# Patient Record
Sex: Female | Born: 1985 | Race: White | Hispanic: No | Marital: Married | Smoking: Never smoker
Health system: Southern US, Community
[De-identification: ages and names within clinical notes are randomized; demographics above are authoritative.]

## PROBLEM LIST (undated history)

## (undated) DIAGNOSIS — N200 Calculus of kidney: Secondary | ICD-10-CM

## (undated) DIAGNOSIS — K449 Diaphragmatic hernia without obstruction or gangrene: Secondary | ICD-10-CM

## (undated) DIAGNOSIS — F329 Major depressive disorder, single episode, unspecified: Secondary | ICD-10-CM

## (undated) DIAGNOSIS — B009 Herpesviral infection, unspecified: Secondary | ICD-10-CM

## (undated) DIAGNOSIS — R17 Unspecified jaundice: Secondary | ICD-10-CM

## (undated) DIAGNOSIS — Z800 Family history of malignant neoplasm of digestive organs: Secondary | ICD-10-CM

## (undated) DIAGNOSIS — F419 Anxiety disorder, unspecified: Secondary | ICD-10-CM

## (undated) DIAGNOSIS — IMO0002 Reserved for concepts with insufficient information to code with codable children: Secondary | ICD-10-CM

## (undated) DIAGNOSIS — K599 Functional intestinal disorder, unspecified: Secondary | ICD-10-CM

## (undated) DIAGNOSIS — Z8041 Family history of malignant neoplasm of ovary: Secondary | ICD-10-CM

## (undated) DIAGNOSIS — Z87442 Personal history of urinary calculi: Secondary | ICD-10-CM

## (undated) DIAGNOSIS — Z1379 Encounter for other screening for genetic and chromosomal anomalies: Principal | ICD-10-CM

## (undated) HISTORY — PX: TONSILLECTOMY: SUR1361

## (undated) HISTORY — DX: Herpesviral infection, unspecified: B00.9

## (undated) HISTORY — DX: Unspecified jaundice: R17

## (undated) HISTORY — DX: Gilbert syndrome: E80.4

## (undated) HISTORY — PX: COLPOSCOPY: SHX161

## (undated) HISTORY — DX: Functional intestinal disorder, unspecified: K59.9

## (undated) HISTORY — DX: Encounter for other screening for genetic and chromosomal anomalies: Z13.79

## (undated) HISTORY — DX: Anxiety disorder, unspecified: F41.9

## (undated) HISTORY — PX: SHOULDER ARTHROSCOPY WITH ROTATOR CUFF REPAIR AND OPEN BICEPS TENODESIS: SHX6677

## (undated) HISTORY — DX: Major depressive disorder, single episode, unspecified: F32.9

## (undated) HISTORY — DX: Diaphragmatic hernia without obstruction or gangrene: K44.9

## (undated) HISTORY — DX: Family history of malignant neoplasm of ovary: Z80.41

## (undated) HISTORY — DX: Reserved for concepts with insufficient information to code with codable children: IMO0002

## (undated) HISTORY — DX: Family history of malignant neoplasm of digestive organs: Z80.0

---

## 2007-11-16 ENCOUNTER — Encounter: Admission: RE | Admit: 2007-11-16 | Discharge: 2007-11-16 | Payer: Self-pay | Admitting: Family Medicine

## 2007-12-06 ENCOUNTER — Emergency Department (HOSPITAL_COMMUNITY): Admission: EM | Admit: 2007-12-06 | Discharge: 2007-12-06 | Payer: Self-pay | Admitting: Emergency Medicine

## 2008-04-27 ENCOUNTER — Emergency Department (HOSPITAL_COMMUNITY): Admission: EM | Admit: 2008-04-27 | Discharge: 2008-04-27 | Payer: Self-pay | Admitting: Family Medicine

## 2008-10-17 ENCOUNTER — Encounter: Admission: RE | Admit: 2008-10-17 | Discharge: 2008-10-17 | Payer: Self-pay

## 2010-10-31 DIAGNOSIS — R87619 Unspecified abnormal cytological findings in specimens from cervix uteri: Secondary | ICD-10-CM

## 2010-10-31 DIAGNOSIS — IMO0002 Reserved for concepts with insufficient information to code with codable children: Secondary | ICD-10-CM

## 2010-10-31 HISTORY — DX: Unspecified abnormal cytological findings in specimens from cervix uteri: R87.619

## 2010-10-31 HISTORY — DX: Reserved for concepts with insufficient information to code with codable children: IMO0002

## 2011-08-21 LAB — URINALYSIS, ROUTINE W REFLEX MICROSCOPIC
Bilirubin Urine: NEGATIVE
Glucose, UA: NEGATIVE
Nitrite: NEGATIVE
Urobilinogen, UA: 0.2

## 2011-08-21 LAB — URINE MICROSCOPIC-ADD ON

## 2011-08-21 LAB — PREGNANCY, URINE: Preg Test, Ur: NEGATIVE

## 2011-11-06 ENCOUNTER — Encounter: Payer: Self-pay | Admitting: Emergency Medicine

## 2011-11-06 ENCOUNTER — Emergency Department (HOSPITAL_COMMUNITY)
Admission: EM | Admit: 2011-11-06 | Discharge: 2011-11-06 | Disposition: A | Discharge status: Home / Self Care | Payer: BC Managed Care – PPO | Attending: Emergency Medicine | Admitting: Emergency Medicine

## 2011-11-06 ENCOUNTER — Emergency Department (HOSPITAL_COMMUNITY): Payer: BC Managed Care – PPO

## 2011-11-06 DIAGNOSIS — R109 Unspecified abdominal pain: Secondary | ICD-10-CM | POA: Insufficient documentation

## 2011-11-06 DIAGNOSIS — N201 Calculus of ureter: Secondary | ICD-10-CM

## 2011-11-06 HISTORY — DX: Calculus of kidney: N20.0

## 2011-11-06 LAB — URINALYSIS, DIPSTICK ONLY
Glucose, UA: NEGATIVE mg/dL
Protein, ur: NEGATIVE mg/dL
Specific Gravity, Urine: 1.024 (ref 1.005–1.030)
Urobilinogen, UA: 0.2 mg/dL (ref 0.0–1.0)

## 2011-11-06 LAB — POCT I-STAT, CHEM 8
Calcium, Ion: 1.14 mmol/L (ref 1.12–1.32)
HCT: 39 % (ref 36.0–46.0)
Sodium: 142 mEq/L (ref 135–145)

## 2011-11-06 MED ORDER — ONDANSETRON HCL 4 MG/2ML IJ SOLN
4.0000 mg | Freq: Once | INTRAMUSCULAR | Status: AC
  Administered 2011-11-06: 4 mg via INTRAVENOUS
  Filled 2011-11-06: qty 2

## 2011-11-06 MED ORDER — KETOROLAC TROMETHAMINE 30 MG/ML IJ SOLN
30.0000 mg | Freq: Once | INTRAMUSCULAR | Status: AC
  Administered 2011-11-06: 30 mg via INTRAVENOUS
  Filled 2011-11-06: qty 1

## 2011-11-06 MED ORDER — HYDROMORPHONE HCL PF 1 MG/ML IJ SOLN
1.0000 mg | Freq: Once | INTRAMUSCULAR | Status: AC
  Administered 2011-11-06: 1 mg via INTRAVENOUS

## 2011-11-06 MED ORDER — SODIUM CHLORIDE 0.9 % IV SOLN
Freq: Once | INTRAVENOUS | Status: AC
  Administered 2011-11-06: 05:00:00 via INTRAVENOUS

## 2011-11-06 MED ORDER — ONDANSETRON HCL 4 MG PO TABS: 4.0000 mg | ORAL_TABLET | Freq: Four times a day (QID) | ORAL | Status: AC | PRN

## 2011-11-06 MED ORDER — IBUPROFEN 800 MG PO TABS: 800.0000 mg | ORAL_TABLET | Freq: Three times a day (TID) | ORAL | Status: AC

## 2011-11-06 MED ORDER — OXYCODONE-ACETAMINOPHEN 5-325 MG PO TABS: 1.0000 | ORAL_TABLET | Freq: Four times a day (QID) | ORAL | Status: AC | PRN

## 2011-11-06 MED ORDER — TAMSULOSIN HCL 0.4 MG PO CAPS: 0.4000 mg | ORAL_CAPSULE | Freq: Every day | ORAL | Status: DC

## 2011-11-06 MED ORDER — HYDROMORPHONE HCL PF 2 MG/ML IJ SOLN
INTRAMUSCULAR | Status: AC
  Filled 2011-11-06: qty 1

## 2011-11-06 NOTE — ED Notes (Signed)
Pt alert, nad, c/o right flank pain, onset sudden, sharp, non radiating, skin pwd, resp even unlabored, denies changes in bowel or bladder, c/o mild nausea

## 2011-11-06 NOTE — ED Provider Notes (Signed)
Medical screening examination/treatment/procedure(s) were performed by non-physician practitioner and as supervising physician I was immediately available for consultation/collaboration.  Olivia Mackie, MD 11/06/11 [phone]

## 2011-11-06 NOTE — ED Provider Notes (Signed)
Ct Abdomen Pelvis Wo Contrast  11/06/2011  *RADIOLOGY REPORT*  Clinical Data: Right flank pain and right lower quadrant abdominal pain.  Nausea and hematuria.  CT ABDOMEN AND PELVIS WITHOUT CONTRAST  Technique:  Multidetector CT imaging of the abdomen and pelvis was performed following the standard protocol without intravenous contrast.  Comparison: CT of the abdomen and pelvis performed 12/06/2007  Findings: The visualized lung bases are clear.  The liver and spleen are unremarkable in appearance.  The gallbladder is within normal limits.  The pancreas and adrenal glands are unremarkable.  There is no evidence of hydronephrosis.  However, there is slightly asymmetric prominence of the right ureter, at least proximally, with suspicion of a small 3 mm stone at the distal right ureter, just above the vesicoureteral junction.  This likely reflects an intermittently obstructing stone, causing the patient's symptoms.  Scattered nonobstructing bilateral renal stones are seen, measuring up to 3 mm in size.  The kidneys are otherwise unremarkable in appearance.  There is no evidence of perinephric stranding.  No free fluid is identified.  There is mild fecalization of the distal ileum; this suggests some degree of small bowel dysmotility. The remainder of the small bowel is decompressed and unremarkable in appearance.  The stomach is within normal limits.  No acute vascular abnormalities are seen.  The appendix is normal in caliber, without evidence for appendicitis.  The colon is largely filled with stool and is unremarkable in appearance.  The bladder is mildly distended and grossly unremarkable in appearance; a small urachal remnant is incidentally noted.  The uterus is grossly unremarkable; the ovaries are relatively symmetric.  No suspicious adnexal masses are seen.  A tampon is noted within the vagina.  No inguinal lymphadenopathy is seen.  No acute osseous abnormalities are identified.  A focal exostosis is noted  arising from the posteromedial left 12th rib, with associated atelectasis.  This impresses on the left diaphragm.  IMPRESSION:  1.  No evidence of hydronephrosis.  However, there is slightly asymmetric prominence of the right ureter, with a suspected 3 mm stone at the distal right ureter, just above the vesicoureteral junction.  This likely reflects an intermittently obstructing stone, causing the patient's symptoms. 2.  Scattered small nonobstructing bilateral renal stones seen, measuring up to 3 mm in size. 3.  Mild fecalization of the distal ileum suggests some degree of small bowel dysmotility; small bowel otherwise unremarkable in appearance. 4.  Focal benign exostosis incidentally noted arising from the posteromedial left 12th rib, with associated atelectasis.  This impresses on the left diaphragm, and appears stable from 2009.  Original Report Authenticated By: Tonia Ghent, M.D.    Patient's care was assumed by myself at the beginning of shift. I reviewed her labs and imaging study. She has evidence of a 3 mm stone in the distal right ureter as described as intermittently obstructing. As her blood work shows no indication of renal insufficiency and her urinalysis shows no evidence of infection, she is good candidate for outpatient management with a close urology followup. I will send her home with prescriptions for ibuprofen, Zofran, Percocet, and Flomax with instructions to call the urologist today.   [address] [place], [place] 11/06/11 [phone]

## 2011-11-06 NOTE — ED Provider Notes (Addendum)
History     CSN: [id number] Arrival date & time: 11/06/2011  3:19 AM   First MD Initiated Contact with Patient 11/06/11 0423      Chief Complaint  Patient presents with  . Flank Pain    Right    (Consider location/radiation/quality/duration/timing/severity/associated sxs/prior treatment) HPI Comments: Acute onset right flank pain were person from sleep, nausea, and vomiting as a result pain has a history of kidney stone 2-3 years ago on the right, which needed.  Lithotripsy, as she was unable to pass down this was in [facility], has no local urologist or PCP.  Has not taken any over-the-counter medicines for pain on nausea  Patient is a 25 y.o. female presenting with flank pain. The history is provided by the patient.  Flank Pain This is a new problem. The current episode started today. The problem occurs constantly. The problem has been rapidly worsening. Associated symptoms include nausea and vomiting. Pertinent negatives include no abdominal pain, chills or fever. The symptoms are aggravated by nothing. She has tried nothing for the symptoms. The treatment provided no relief.    Past Medical History  Diagnosis Date  . Kidney stones     rt with lithotripsy    History reviewed. No pertinent past surgical history.  No family history on file.  History  Substance Use Topics  . Smoking status: Not on file  . Smokeless tobacco: Not on file  . Alcohol Use: Not on file    OB History    Grav Para Term Preterm Abortions TAB SAB Ect Mult Living                  Review of Systems  Constitutional: Negative for fever and chills.  HENT: Negative.   Eyes: Negative.   Respiratory: Negative.   Cardiovascular: Negative.   Gastrointestinal: Positive for nausea and vomiting. Negative for abdominal pain.  Genitourinary: Positive for flank pain. Negative for dysuria and hematuria.  Musculoskeletal: Negative.   Neurological: Negative.   Hematological: Negative.     Psychiatric/Behavioral: Negative.     Allergies  Review of patient's allergies indicates no known allergies.  Home Medications   Current Outpatient Rx  Name Route Sig Dispense Refill  . CITALOPRAM HYDROBROMIDE 20 MG PO TABS Oral Take 10 mg by mouth daily. Patient is trying to reduce her intake so she tries to take it every other day     . TAMSULOSIN HCL 0.4 MG PO CAPS Oral Take 1 capsule (0.4 mg total) by mouth daily. 10 capsule 0    BP 104/47  Pulse 67  Temp(Src) 97.8 F (36.6 C) (Oral)  Resp 16  Wt 150 lb (68.04 kg)  SpO2 100%  LMP 11/06/2011  Physical Exam  ED Course  Procedures (including critical care time)  Labs Reviewed  URINALYSIS, DIPSTICK ONLY - Abnormal; Notable for the following:    Hgb urine dipstick LARGE (*)    Leukocytes, UA TRACE (*)    All other components within normal limits  POCT I-STAT, CHEM 8 - Abnormal; Notable for the following:    Potassium 3.4 (*)    Glucose, Bld 127 (*)    All other components within normal limits  POCT PREGNANCY, URINE  LAB REPORT - SCANNED   No results found.   1. Right ureteral stone       MDM  Will evaluate, urine, and kidney function provide IV hydration, pain control antiemetic.  Review CT scan        Arman Filter,  NP 11/06/11 0439  Arman Filter, NP 12/26/11 1939

## 2011-11-06 NOTE — ED Notes (Signed)
Patient transported to CT 

## 2011-11-06 NOTE — ED Provider Notes (Signed)
Medical screening examination/treatment/procedure(s) were performed by non-physician practitioner and as supervising physician I was immediately available for consultation/collaboration.  Olivia Mackie, MD 11/06/11 [phone]

## 2012-01-15 NOTE — ED Provider Notes (Signed)
Medical screening examination/treatment/procedure(s) were performed by non-physician practitioner and as supervising physician I was immediately available for consultation/collaboration.  Olivia Mackie, MD 01/15/12 [phone]

## 2012-07-14 ENCOUNTER — Other Ambulatory Visit: Payer: Self-pay | Admitting: Family Medicine

## 2012-07-14 DIAGNOSIS — R1011 Right upper quadrant pain: Secondary | ICD-10-CM

## 2012-07-16 ENCOUNTER — Other Ambulatory Visit (HOSPITAL_COMMUNITY): Payer: Self-pay | Admitting: Family Medicine

## 2012-07-16 ENCOUNTER — Ambulatory Visit
Admission: RE | Admit: 2012-07-16 | Discharge: 2012-07-16 | Disposition: A | Discharge status: Home / Self Care | Payer: BC Managed Care – PPO | Source: Ambulatory Visit | Attending: Family Medicine | Admitting: Family Medicine

## 2012-07-16 DIAGNOSIS — R1011 Right upper quadrant pain: Secondary | ICD-10-CM

## 2012-07-26 ENCOUNTER — Encounter (HOSPITAL_COMMUNITY)
Admission: RE | Admit: 2012-07-26 | Discharge: 2012-07-26 | Disposition: A | Discharge status: Home / Self Care | Payer: BC Managed Care – PPO | Source: Ambulatory Visit | Attending: Family Medicine | Admitting: Family Medicine

## 2012-07-26 DIAGNOSIS — R1011 Right upper quadrant pain: Secondary | ICD-10-CM

## 2012-07-26 MED ORDER — TECHNETIUM TC 99M MEBROFENIN IV KIT
5.0000 | PACK | Freq: Once | INTRAVENOUS | Status: AC | PRN
  Administered 2012-07-26: 5 via INTRAVENOUS

## 2012-07-26 MED ORDER — SINCALIDE 5 MCG IJ SOLR
0.0200 ug/kg | Freq: Once | INTRAMUSCULAR | Status: AC
  Administered 2012-07-26: 1.4 ug via INTRAVENOUS

## 2013-06-07 ENCOUNTER — Encounter: Payer: Self-pay | Admitting: Obstetrics and Gynecology

## 2013-06-07 ENCOUNTER — Ambulatory Visit (INDEPENDENT_AMBULATORY_CARE_PROVIDER_SITE_OTHER): Payer: BC Managed Care – PPO | Admitting: Obstetrics and Gynecology

## 2013-06-07 VITALS — BP 100/60 | Ht 64.75 in | Wt 149.0 lb

## 2013-06-07 DIAGNOSIS — N9489 Other specified conditions associated with female genital organs and menstrual cycle: Secondary | ICD-10-CM

## 2013-06-07 MED ORDER — ETONOGESTREL-ETHINYL ESTRADIOL 0.12-0.015 MG/24HR VA RING: VAGINAL_RING | VAGINAL | Status: DC

## 2013-06-07 NOTE — Patient Instructions (Signed)
Start nuva ring.  If pain persists after 3 months, return and see Dr Edward Jolly and consider laparoscopy.

## 2013-06-07 NOTE — Progress Notes (Signed)
27 yo MWF G0P0 with recurrent episodes of abdominal pain.  Had eval of GB and was told it was nl.  Using condoms for bc.  Quit nuva ring last summer and periods were nl for awhile, but for the last 2 months menses have been so light that she could have used a single tampon for all 5 days of bleeding.  Pt saw a doctor in [place] a few months ago and she did an USG which showed cysts on her ovaries which were small and was asked to have a repeat PUS 6-8 weeks later and to continue her nuva ring.  Pt has rather severe dysmenorrhea despite almost no bleeding.  Also has dyspareunia and post coital bleeding.  USG did not include a SHSG.  Pt was having dysmenorrhea even when on nuva ring.  Pt also having constipation, with BM's difficult to evacuate, and occuring q2days.  Pap smear about 2 months ago nl.  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  Pain during her period is incapacitating and alters her usual activities.  Records reviewed and show a 3 cm poss endometrioma on left.  Exam:  Abd soft and flat.  Nl BS.     Ext gen nl.     Vag and cx nl.      BM: uterus tender to motion but is mobile and is nl size. Adnexa not tender, no masses.  A:  Recurrent pelvic pain, prob endometriosis  P:  Long discussion with patient regarding options for management including laparoscopy, coc's or lupron.  Risks and benefits of each discussed and questions answered.  Pt states she wants to try the nuva ring first, and will consider laparoscopy if it doesn't help.  She and her husband do not desire children, so infertility is not really a concern for her.  She states she feels better now that she understands why her other doctor rec: nuva and is comfortable starting it.  Rx for 1 year.  Pt will return in 3 months if her pain is not controlled with nuva.

## 2013-07-13 ENCOUNTER — Telehealth: Payer: Self-pay | Admitting: Obstetrics and Gynecology

## 2013-07-13 NOTE — Telephone Encounter (Signed)
Left message on call back # to return call for medication.

## 2013-07-13 NOTE — Telephone Encounter (Signed)
Patient last OV 06/07/2013 EPIC States she previously was on Lexapro for couple years but stopped and has done ok with out till now and is requesting if you can call in for her due to her feelings now of week before cycle having mood swings , feels borderline neurotic. States she is doing OK with the Nuvaring. She did get mouth sores 2 days after starting this lasted 2 weeks and was aware this was a side effect to the nuvaring. Please advise if Lexapro may be sent to her  Pharmacy [place] , [place].

## 2013-07-13 NOTE — Telephone Encounter (Signed)
Pull her paper chart please

## 2013-07-13 NOTE — Telephone Encounter (Signed)
Patient wants discuss adding lexapro her meds. Due to her having endometriosis.

## 2013-07-15 NOTE — Telephone Encounter (Signed)
I have never evaluated or treated pt for this problem.  I will need to see her in the office, and then if appropriate, I will rx the medicine.

## 2013-07-15 NOTE — Telephone Encounter (Signed)
Left message on CB#VM of need to return call concerning Rx request for Lexapro.

## 2013-07-18 NOTE — Telephone Encounter (Signed)
Calling patient concerning Rx request for Lexapro to inform of need for appointment with Dr. Tresa Res. Patient stated was going to call our office today to get appointment . At her last visit with Dr. Tresa Res she had discussed  Her options concerning endometriosis. Patient last OV on 06/07/2013. Patient states she has had a very discomforting week end due to heavy bleeding and nausea symptoms .,. 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 Edward Jolly who Dr. Tresa Res had suggested she follow through with if problems continue.  Patient scheduled appointment with Dr. Edward Jolly for Thursday at 7:45am.

## 2013-07-21 ENCOUNTER — Ambulatory Visit (INDEPENDENT_AMBULATORY_CARE_PROVIDER_SITE_OTHER): Payer: BC Managed Care – PPO | Admitting: Obstetrics and Gynecology

## 2013-07-21 ENCOUNTER — Encounter: Payer: Self-pay | Admitting: Obstetrics and Gynecology

## 2013-07-21 VITALS — BP 100/58 | HR 70 | Ht 64.75 in | Wt 148.5 lb

## 2013-07-21 DIAGNOSIS — N943 Premenstrual tension syndrome: Secondary | ICD-10-CM

## 2013-07-21 DIAGNOSIS — N83209 Unspecified ovarian cyst, unspecified side: Secondary | ICD-10-CM

## 2013-07-21 DIAGNOSIS — K5900 Constipation, unspecified: Secondary | ICD-10-CM

## 2013-07-21 DIAGNOSIS — F3281 Premenstrual dysphoric disorder: Secondary | ICD-10-CM

## 2013-07-21 LAB — TSH: TSH: 1.566 u[IU]/mL (ref 0.350–4.500)

## 2013-07-21 MED ORDER — ESCITALOPRAM OXALATE 10 MG PO TABS: 10.0000 mg | ORAL_TABLET | Freq: Every day | ORAL | Status: DC

## 2013-07-21 NOTE — Progress Notes (Signed)
Patient ID: Beth Huerta, female   DOB: 07-20-1986, 27 y.o.   MRN: [id number]  27 y.o.   Married    Caucasian   female   G0P0000   here for recheck. Biggest concern is severe constipation during her cycle.   Started one year ago.   Took Percocet this summer when she was off the NuvaRing.  Since restarting the NuvaRing, constipation is still not much better, but the cramping is better.   Difficulty walking.  Spending hours in the bathroom due to difficulty to have BMs. Misses activities due to pain.   Bleeding is very light and old blood mixed with mucous.    Stopped Lexapro and notes increased mood swings and patient is worried about this.  Was last on it two years ago to treat anxiety. Notes increase in anxiety for the last 6 - 7 months.  Symptoms are mainly right before menses. No suicidal or homicidal ideation.  Lashing out.   Seen a gastroenterologist and had a normal gall bladder evaluation including ultrasound. No colonoscopy to date.    Last ultrasound of pelvis was 6 months ago in [place] and cysts were noted.    Notes some mouth sores since back on NuvaRing.  Used in the past without the problems.  Does not remember pills well.  Does not want to switch.     Considering future childbearing but not at this time.   Maternal grandmother and maternal aunt diagnosed with ovarian cancer. Patient mother tested negative for BRCA testing.   Patient concerned about this.  Insurance would not pay for it within the last year.    Patient's last menstrual period was 07/14/2013.          Sexually active: yes     Family History  Problem Relation Age of Onset  . Ovarian cancer Maternal Grandmother   . Heart disease Paternal Grandfather   . Cancer Father     lung cancer    There are no active problems to display for this patient.   Past Medical History  Diagnosis Date  . Kidney stones     rt with lithotripsy  . Depression     and anxiety  . Abnormal Pap smear  10/2010    colpo/CIN II    Past Surgical History  Procedure Laterality Date  . Tonsillectomy    . Colposcopy      CIN II    Allergies: Review of patient's allergies indicates no known allergies.  Current Outpatient Prescriptions  Medication Sig Dispense Refill  . Acetaminophen (TYLENOL PO) Take by mouth as needed.      . etonogestrel-ethinyl estradiol (NUVARING) 0.12-0.015 MG/24HR vaginal ring Insert vaginally and leave in place for 3 consecutive weeks, then remove for 1 week.  3 each  3  . IBUPROFEN PO Take by mouth as needed.       No current facility-administered medications for this visit.    ROS: Pertinent items are noted in HPI.  Social Hx:    Exam:    BP 100/58  Pulse 70  Ht 5' 4.75" (1.645 m)  Wt 148 lb 8 oz (67.359 kg)  BMI 24.89 kg/m2  LMP 07/14/2013   Wt Readings from Last 3 Encounters:  07/21/13 148 lb 8 oz (67.359 kg)  06/07/13 149 lb (67.586 kg)  11/06/11 150 lb (68.04 kg)     Ht Readings from Last 3 Encounters:  07/21/13 5' 4.75" (1.645 m)  06/07/13 5' 4.75" (1.645 m)    General appearance:  alert, cooperative and appears stated age  Abdomen: soft, non-tender; bowel sounds normal; no masses,  no organomegaly Neurologic: Grossly normal   Pelvic: External genitalia:  no lesions              Urethra:  normal appearing urethra with no masses, tenderness or lesions              Bartholins and Skenes: normal                 Vagina: normal appearing vagina with normal color and discharge, no lesions              Cervix: normal appearance                     Bimanual Exam:  Uterus:  uterus is normal size, shape, consistency and nontender.  NuvaRing in place.                                      Adnexa: normal adnexa in size, nontender and no masses                                      Rectovaginal: Confirms                                      Anus:  normal sphincter tone, no lesions  Assessment  Severe constipation during  menses. Dysmenorrhea. Ovarian cyst Mood swings. Premenstrual dysphoric disorder.  Plan  Check TSH. Continue NuvaRing Referral to Gi for colonoscopy Return for pelvic ultrasound Restart Lexapro.  An After Visit Summary was printed and given to the patient.

## 2013-07-21 NOTE — Patient Instructions (Signed)
Constipation, Adult Constipation is when a person has fewer than 3 bowel movements a week; has difficulty having a bowel movement; or has stools that are dry, hard, or larger than normal. As people grow older, constipation is more common. If you try to fix constipation with medicines that make you have a bowel movement (laxatives), the problem may get worse. Long-term laxative use may cause the muscles of the colon to become weak. A low-fiber diet, not taking in enough fluids, and taking certain medicines may make constipation worse. CAUSES   Certain medicines, such as antidepressants, pain medicine, iron supplements, antacids, and water pills.   Certain diseases, such as diabetes, irritable bowel syndrome (IBS), thyroid disease, or depression.   Not drinking enough water.   Not eating enough fiber-rich foods.   Stress or travel.  Lack of physical activity or exercise.  Not going to the restroom when there is the urge to have a bowel movement.  Ignoring the urge to have a bowel movement.  Using laxatives too much. SYMPTOMS   Having fewer than 3 bowel movements a week.   Straining to have a bowel movement.   Having hard, dry, or larger than normal stools.   Feeling full or bloated.   Pain in the lower abdomen.  Not feeling relief after having a bowel movement. DIAGNOSIS  Your caregiver will take a medical history and perform a physical exam. Further testing may be done for severe constipation. Some tests may include:   A barium enema X-ray to examine your rectum, colon, and sometimes, your small intestine.  A sigmoidoscopy to examine your lower colon.  A colonoscopy to examine your entire colon. TREATMENT  Treatment will depend on the severity of your constipation and what is causing it. Some dietary treatments include drinking more fluids and eating more fiber-rich foods. Lifestyle treatments may include regular exercise. If these diet and lifestyle recommendations  do not help, your caregiver may recommend taking over-the-counter laxative medicines to help you have bowel movements. Prescription medicines may be prescribed if over-the-counter medicines do not work.  HOME CARE INSTRUCTIONS   Increase dietary fiber in your diet, such as fruits, vegetables, whole grains, and beans. Limit high-fat and processed sugars in your diet, such as [place] fries, hamburgers, cookies, candies, and soda.   A fiber supplement may be added to your diet if you cannot get enough fiber from foods.   Drink enough fluids to keep your urine clear or pale yellow.   Exercise regularly or as directed by your caregiver.   Go to the restroom when you have the urge to go. Do not hold it.  Only take medicines as directed by your caregiver. Do not take other medicines for constipation without talking to your caregiver first. SEEK IMMEDIATE MEDICAL CARE IF:   You have bright red blood in your stool.   Your constipation lasts for more than 4 days or gets worse.   You have abdominal or rectal pain.   You have thin, pencil-like stools.  You have unexplained weight loss. MAKE SURE YOU:   Understand these instructions.  Will watch your condition.  Will get help right away if you are not doing well or get worse. Document Released: 08/15/2004 Document Revised: 02/09/2012 Document Reviewed: 10/21/2011 [facility] Patient Information 2014 [place], [place].  Diagnostic Laparoscopy Laparoscopy is a surgical procedure. It is used to diagnose and treat diseases inside the belly(abdomen). It is usually a brief, common, and relatively simple procedure. The laparoscopeis a thin, lighted, pencil-sized instrument.  It is like a telescope. It is inserted into your abdomen through a small cut (incision). Your caregiver can look at the organs inside your body through this instrument. He or she can see if there is anything abnormal. Laparoscopy can be done either in a hospital or outpatient  clinic. You may be given a mild sedative to help you relax before the procedure. Once in the operating room, you will be given a drug to make you sleep (general anesthesia). Laparoscopy usually lasts less than 1 hour. After the procedure, you will be monitored in a recovery area until you are stable and doing well. Once you are home, it will take 2 to 3 days to fully recover. RISKS AND COMPLICATIONS  Laparoscopy has relatively few risks. Your caregiver will discuss the risks with you before the procedure. Some problems that can occur include:  Infection.  Bleeding.  Damage to other organs.  Anesthetic side effects. PROCEDURE Once you receive anesthesia, your surgeon inflates the abdomen with a harmless gas (carbon dioxide). This makes the organs easier to see. The laparoscope is inserted into the abdomen through a small incision. This allows your surgeon to see into the abdomen. Other small instruments are also inserted into the abdomen through other small openings. Many surgeons attach a video camera to the laparoscope to enlarge the view. During a diagnostic laparoscopy, the surgeon may be looking for inflammation, infection, or cancer. Your surgeon may take tissue samples(biopsies). The samples are sent to a specialist in looking at cells and tissue samples (pathologist). The pathologist examines them under a microscope. Biopsies can help to diagnose or confirm a disease. AFTER THE PROCEDURE   The gas is released from inside the abdomen.  The incisions are closed with stitches (sutures). Because these incisions are small (usually less than 1/2 inch), there is usually minimal discomfort after the procedure. There may be some mild discomfort in the throat. This is from the tube placed in the throat while you were sleeping. You may have some mild abdominal discomfort. There may also be discomfort from the instrument placement incisions in the abdomen.  The recovery time is shortened as long as  there are no complications.  You will rest in a recovery room until stable and doing well. As long as there are no complications, you may be allowed to go home. FINDING OUT THE RESULTS OF YOUR TEST Not all test results are available during your visit. If your test results are not back during the visit, make an appointment with your caregiver to find out the results. Do not assume everything is normal if you have not heard from your caregiver or the medical facility. It is important for you to follow up on all of your test results. HOME CARE INSTRUCTIONS   Take all medicines as directed.  Only take over-the-counter or prescription medicines for pain, discomfort, or fever as directed by your caregiver.  Resume daily activities as directed.  Showers are preferred over baths.  You may resume sexual activities in 1 week or as directed.  Do not drive while taking narcotics. SEEK MEDICAL CARE IF:   There is increasing abdominal pain.  There is new pain in the shoulders (shoulder strap areas).  You feel lightheaded or faint.  You have the chills.  You or your child has an oral temperature above 102 F (38.9 C).  There is pus-like (purulent) drainage from any of the wounds.  You are unable to pass gas or have a bowel movement.  You feel sick to your stomach (nauseous) or throw up (vomit). MAKE SURE YOU:   Understand these instructions.  Will watch your condition.  Will get help right away if you are not doing well or get worse. Document Released: 02/23/2001 Document Revised: 02/09/2012 Document Reviewed: 11/17/2007 [facility] Patient Information 2014 [place], [place].

## 2013-07-27 ENCOUNTER — Encounter: Payer: Self-pay | Admitting: Internal Medicine

## 2013-07-27 ENCOUNTER — Telehealth: Payer: Self-pay | Admitting: Orthopedic Surgery

## 2013-07-27 NOTE — Telephone Encounter (Signed)
LVM re: appt with Dr. [name] 08-30-13 at 2:45, arriving at 2:30. Paperwork to be mailed to pt to fill out and bring, along with insurance card. Phone and address given.

## 2013-07-28 ENCOUNTER — Encounter: Payer: Self-pay | Admitting: *Deleted

## 2013-08-04 ENCOUNTER — Encounter: Payer: Self-pay | Admitting: Obstetrics and Gynecology

## 2013-08-04 ENCOUNTER — Other Ambulatory Visit: Payer: BC Managed Care – PPO

## 2013-08-04 ENCOUNTER — Ambulatory Visit (INDEPENDENT_AMBULATORY_CARE_PROVIDER_SITE_OTHER): Payer: BC Managed Care – PPO | Admitting: Obstetrics and Gynecology

## 2013-08-04 ENCOUNTER — Ambulatory Visit (INDEPENDENT_AMBULATORY_CARE_PROVIDER_SITE_OTHER): Payer: BC Managed Care – PPO

## 2013-08-04 VITALS — BP 110/66 | HR 64 | Ht 64.75 in | Wt 150.0 lb

## 2013-08-04 DIAGNOSIS — N83209 Unspecified ovarian cyst, unspecified side: Secondary | ICD-10-CM

## 2013-08-04 DIAGNOSIS — N83202 Unspecified ovarian cyst, left side: Secondary | ICD-10-CM

## 2013-08-04 NOTE — Patient Instructions (Signed)
Ovarian Cyst The ovaries are small organs that are on each side of the uterus. The ovaries are the organs that produce the female hormones, estrogen and progesterone. An ovarian cyst is a sac filled with fluid that can vary in its size. It is normal for a small cyst to form in women who are in the childbearing age and who have menstrual periods. This type of cyst is called a follicle cyst that becomes an ovulation cyst (corpus luteum cyst) after it produces the women's egg. It later goes away on its own if the woman does not become pregnant. There are other kinds of ovarian cysts that may cause problems and may need to be treated. The most serious problem is a cyst with cancer. It should be noted that menopausal women who have an ovarian cyst are at a higher risk of it being a cancer cyst. They should be evaluated very quickly, thoroughly and followed closely. This is especially true in menopausal women because of the high rate of ovarian cancer in women in menopause. CAUSES AND TYPES OF OVARIAN CYSTS:  FUNCTIONAL CYST: The follicle/corpus luteum cyst is a functional cyst that occurs every month during ovulation with the menstrual cycle. They go away with the next menstrual cycle if the woman does not get pregnant. Usually, there are no symptoms with a functional cyst.  ENDOMETRIOMA CYST: This cyst develops from the lining of the uterus tissue. This cyst gets in or on the ovary. It grows every month from the bleeding during the menstrual period. It is also called a "chocolate cyst" because it becomes filled with blood that turns brown. This cyst can cause pain in the lower abdomen during intercourse and with your menstrual period.  CYSTADENOMA CYST: This cyst develops from the cells on the outside of the ovary. They usually are not cancerous. They can get very big and cause lower abdomen pain and pain with intercourse. This type of cyst can twist on itself, cut off its blood supply and cause severe pain. It  also can easily rupture and cause a lot of pain.  DERMOID CYST: This type of cyst is sometimes found in both ovaries. They are found to have different kinds of body tissue in the cyst. The tissue includes skin, teeth, hair, and/or cartilage. They usually do not have symptoms unless they get very big. Dermoid cysts are rarely cancerous.  POLYCYSTIC OVARY: This is a rare condition with hormone problems that produces many small cysts on both ovaries. The cysts are follicle-like cysts that never produce an egg and become a corpus luteum. It can cause an increase in body weight, infertility, acne, increase in body and facial hair and lack of menstrual periods or rare menstrual periods. Many women with this problem develop type 2 diabetes. The exact cause of this problem is unknown. A polycystic ovary is rarely cancerous.  THECA LUTEIN CYST: Occurs when too much hormone (human chorionic gonadotropin) is produced and over-stimulates the ovaries to produce an egg. They are frequently seen when doctors stimulate the ovaries for invitro-fertilization (test tube babies).  LUTEOMA CYST: This cyst is seen during pregnancy. Rarely it can cause an obstruction to the birth canal during labor and delivery. They usually go away after delivery. SYMPTOMS   Pelvic pain or pressure.  Pain during sexual intercourse.  Increasing girth (swelling) of the abdomen.  Abnormal menstrual periods.  Increasing pain with menstrual periods.  You stop having menstrual periods and you are not pregnant. DIAGNOSIS  The diagnosis can   be made during:  Routine or annual pelvic examination (common).  Ultrasound.  X-ray of the pelvis.  CT Scan.  MRI.  Blood tests. TREATMENT   Treatment may only be to follow the cyst monthly for 2 to 3 months with your caregiver. Many go away on their own, especially functional cysts.  May be aspirated (drained) with a long needle with ultrasound, or by laparoscopy (inserting a tube into  the pelvis through a small incision).  The whole cyst can be removed by laparoscopy.  Sometimes the cyst may need to be removed through an incision in the lower abdomen.  Hormone treatment is sometimes used to help dissolve certain cysts.  Birth control pills are sometimes used to help dissolve certain cysts. HOME CARE INSTRUCTIONS  Follow your caregiver's advice regarding:  Medicine.  Follow up visits to evaluate and treat the cyst.  You may need to come back or make an appointment with another caregiver, to find the exact cause of your cyst, if your caregiver is not a gynecologist.  Get your yearly and recommended pelvic examinations and Pap tests.  Let your caregiver know if you have had an ovarian cyst in the past. SEEK MEDICAL CARE IF:   Your periods are late, irregular, they stop, or are painful.  Your stomach (abdomen) or pelvic pain does not go away.  Your stomach becomes larger or swollen.  You have pressure on your bladder or trouble emptying your bladder completely.  You have painful sexual intercourse.  You have feelings of fullness, pressure, or discomfort in your stomach.  You lose weight for no apparent reason.  You feel generally ill.  You become constipated.  You lose your appetite.  You develop acne.  You have an increase in body and facial hair.  You are gaining weight, without changing your exercise and eating habits.  You think you are pregnant. SEEK IMMEDIATE MEDICAL CARE IF:   You have increasing abdominal pain.  You feel sick to your stomach (nausea) and/or vomit.  You develop a fever that comes on suddenly.  You develop abdominal pain during a bowel movement.  Your menstrual periods become heavier than usual. Document Released: 11/17/2005 Document Revised: 02/09/2012 Document Reviewed: 09/20/2009 ExitCare Patient Information 2014 [employer]. Diagnostic Laparoscopy Laparoscopy is a surgical procedure. It is used to diagnose  and treat diseases inside the belly(abdomen). It is usually a brief, common, and relatively simple procedure. The laparoscopeis a thin, lighted, pencil-sized instrument. It is like a telescope. It is inserted into your abdomen through a small cut (incision). Your caregiver can look at the organs inside your body through this instrument. He or she can see if there is anything abnormal. Laparoscopy can be done either in a hospital or outpatient clinic. You may be given a mild sedative to help you relax before the procedure. Once in the operating room, you will be given a drug to make you sleep (general anesthesia). Laparoscopy usually lasts less than 1 hour. After the procedure, you will be monitored in a recovery area until you are stable and doing well. Once you are home, it will take 2 to 3 days to fully recover. RISKS AND COMPLICATIONS  Laparoscopy has relatively few risks. Your caregiver will discuss the risks with you before the procedure. Some problems that can occur include:  Infection.  Bleeding.  Damage to other organs.  Anesthetic side effects. PROCEDURE Once you receive anesthesia, your surgeon inflates the abdomen with a harmless gas (carbon dioxide). This makes the organs   easier to see. The laparoscope is inserted into the abdomen through a small incision. This allows your surgeon to see into the abdomen. Other small instruments are also inserted into the abdomen through other small openings. Many surgeons attach a video camera to the laparoscope to enlarge the view. During a diagnostic laparoscopy, the surgeon may be looking for inflammation, infection, or cancer. Your surgeon may take tissue samples(biopsies). The samples are sent to a specialist in looking at cells and tissue samples (pathologist). The pathologist examines them under a microscope. Biopsies can help to diagnose or confirm a disease. AFTER THE PROCEDURE   The gas is released from inside the abdomen.  The incisions  are closed with stitches (sutures). Because these incisions are small (usually less than 1/2 inch), there is usually minimal discomfort after the procedure. There may be some mild discomfort in the throat. This is from the tube placed in the throat while you were sleeping. You may have some mild abdominal discomfort. There may also be discomfort from the instrument placement incisions in the abdomen.  The recovery time is shortened as long as there are no complications.  You will rest in a recovery room until stable and doing well. As long as there are no complications, you may be allowed to go home. FINDING OUT THE RESULTS OF YOUR TEST Not all test results are available during your visit. If your test results are not back during the visit, make an appointment with your caregiver to find out the results. Do not assume everything is normal if you have not heard from your caregiver or the medical facility. It is important for you to follow up on all of your test results. HOME CARE INSTRUCTIONS   Take all medicines as directed.  Only take over-the-counter or prescription medicines for pain, discomfort, or fever as directed by your caregiver.  Resume daily activities as directed.  Showers are preferred over baths.  You may resume sexual activities in 1 week or as directed.  Do not drive while taking narcotics. SEEK MEDICAL CARE IF:   There is increasing abdominal pain.  There is new pain in the shoulders (shoulder strap areas).  You feel lightheaded or faint.  You have the chills.  You or your child has an oral temperature above 102 F (38.9 C).  There is pus-like (purulent) drainage from any of the wounds.  You are unable to pass gas or have a bowel movement.  You feel sick to your stomach (nauseous) or throw up (vomit). MAKE SURE YOU:   Understand these instructions.  Will watch your condition.  Will get help right away if you are not doing well or get worse. Document  Released: 02/23/2001 Document Revised: 02/09/2012 Document Reviewed: 11/17/2007 ExitCare Patient Information 2014 [employer].  

## 2013-08-04 NOTE — Progress Notes (Signed)
Subjective  Patient here for pelvic ultrasound. History of left ovarian cysts.  Has appointment 08/30/13 with Dr. Juanda Chance - GI - for constipation symptoms during menses.  Overall doing well with NuvaRing.  Still with post coital bleeding, pain with intercourse, and painful menses, that are worsening. Pain issues are bad enough that patient would like to consider surgery.   Wants to know if is OK to do a 1/2 marathon.  Objective  See ultrasound below.  Left ovarian cyst, 3.4 cm with internal echoes, consistent with possible endometrioma.  Normal left ovary and uterus.      Assessment  Constipation. Dysmenorrhea. Dyspareunia. Left ovarian cyst, possible endometrioma.  Plan  Keep appointment with Dr. Juanda Chance. Repeat ultrasound in 6 weeks. OK to continue with exercise regimen. Signs and symptoms of torsion reviewed.  I told the patient that this is not likely given the small size of the cyst.  Discussed with the patient the possibility of a laparoscopy.  Will wait to do final planning after has GI consult, colonoscopy, and repeat ultrasound.

## 2013-08-30 ENCOUNTER — Encounter: Payer: Self-pay | Admitting: Internal Medicine

## 2013-08-30 ENCOUNTER — Ambulatory Visit (INDEPENDENT_AMBULATORY_CARE_PROVIDER_SITE_OTHER): Payer: BC Managed Care – PPO | Admitting: Internal Medicine

## 2013-08-30 VITALS — BP 108/68 | HR 68 | Ht 64.5 in | Wt 149.4 lb

## 2013-08-30 DIAGNOSIS — R198 Other specified symptoms and signs involving the digestive system and abdomen: Secondary | ICD-10-CM

## 2013-08-30 DIAGNOSIS — R1031 Right lower quadrant pain: Secondary | ICD-10-CM

## 2013-08-30 MED ORDER — HYOSCYAMINE SULFATE 0.125 MG SL SUBL: 0.1250 mg | SUBLINGUAL_TABLET | SUBLINGUAL | Status: DC | PRN

## 2013-08-30 MED ORDER — MOVIPREP 100 G PO SOLR: 1.0000 | Freq: Once | ORAL | Status: DC

## 2013-08-30 NOTE — Patient Instructions (Addendum)
You have been scheduled for a colonoscopy with propofol. Please follow written instructions given to you at your visit today.  Please pick up your prep kit at the pharmacy within the next 1-3 days. If you use inhalers (even only as needed), please bring them with you on the day of your procedure. Your physician has requested that you go to www.startemmi.com and enter the access code given to you at your visit today. This web site gives a general overview about your procedure. However, you should still follow specific instructions given to you by our office regarding your preparation for the procedure.  We have sent the following medications to your pharmacy for you to pick up at your convenience: Levsin  Your physician has requested that you go to the basement for the following lab work before leaving today: CBC, Sed Rate  CC: Dr Edward Jolly

## 2013-08-30 NOTE — Progress Notes (Signed)
Beth Huerta February 20, 1986 MRN [id number]   History of Present Illness:  This is a 27 year old white female with episodic right middle lower and upper quadrant abdominal pain which started about a year ago in [place], [place]. She woke up with severe pain which lasted about 4-5 hours. Since then, she has had intermittent abdominal pain and severe constipation while on her period. In between her periods, she has somewhat loose stools about 3 times a day. She denies any rectal bleeding or weight loss. There is no family history of Crohn's disease or colitis. She was evaluated for lower abdominal pain by Dr.Silva and was diagnosed with possible endometriosis based on a pelvic ultrasound which showed a 4 cm left ovarian cyst suggestive of endometrioma. She also had a normal upper abdominal ultrasound and HIDA scan with ejection fraction of 65%. She has a positive family history of gallbladder disease in her father. She has a left ureteral stone and had colic in 2009 and in 2013. She, at that time, had a CT scan of the abdomen which did not show any evidence of terminal ileum Crohn's disease.    Past Medical History  Diagnosis Date  . Kidney stones     rt with lithotripsy  . Depression     and anxiety  . 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 Colonic dysmotility     small bowel  . Anxiety    Past Surgical History  Procedure Laterality Date  . Tonsillectomy    . Colposcopy      CIN II    reports that she has never smoked. She has never used smokeless tobacco. She reports that she drinks about 0.5 ounces of alcohol per week. She reports that she does not use illicit drugs. family history includes Colon cancer in her maternal grandmother; Heart disease in her paternal grandfather; Lung cancer in her father; Ovarian cancer in her maternal aunt and maternal grandmother. No Known Allergies      Review of Systems:Denies nausea vomiting between periods but having a lot of pain and severe  constipation while on her.  The remainder of the 10 point ROS is negative except as outlined in H&P   Physical Exam: General appearance  Well developed, in no distress.Healthy appearing  Eyes- non icteric. HEENT nontraumatic, normocephalic. Mouth no lesions, tongue papillated, no cheilosis. Neck supple without adenopathy, thyroid not enlarged, no carotid bruits, no JVD. Lungs Clear to auscultation bilaterally. Cor normal S1, normal S2, regular rhythm, no murmur,  quiet precordium. Abdomen: Soft flat relaxed abdomen with normal active bowel sounds. No tympany. No distention. Liver edge at costal margin. No scars. No tenderness in any quadrant. No CVA tenderness.  Rectal:Small amount of soft Hemoccult negative stool.  Extremities no pedal edema. Skin no lesions. Neurological alert and oriented x 3. Psychological normal mood and affect.  Assessment and Plan:  Problem #1 This is a 27 year old white female with intermittent severe constipation,  right lower and middle quadrant abdominal pain which needs to be further evaluated. Gallbladder disease has been ruled out. Another possibility is inflammatory bowel disease involving the right colon. We also need to consider endometriosis involving the surface of her bowel. Her symptoms are most prominent during her period which is suggestive of endometriosis .She will be scheduled for laparoscopic surgery by Dr.Siva to make a diagnosis. From my standpoint, we need to rule out inflammatory bowel disease, so we will schedule a colonoscopy and possible biopsies. Endometriosis can rarely cause a partial  obstruction of the colon if it is advanced and infiltrates colon wall.. It may also effect the motility of the colon. I will give her Levsin sublingually 0.125 mg to take on a when necessary basis. She may need a double prep for colonoscopy.  . I would like to do the colonoscopy while she is on  her period and when her symptoms of abdominal pain are most  pronounced. We have discussed the prep, the sedation and the procedure.    08/30/2013 [name]

## 2013-08-31 ENCOUNTER — Encounter: Payer: Self-pay | Admitting: Obstetrics and Gynecology

## 2013-08-31 ENCOUNTER — Encounter: Payer: Self-pay | Admitting: Internal Medicine

## 2013-08-31 ENCOUNTER — Telehealth: Payer: Self-pay | Admitting: Obstetrics and Gynecology

## 2013-08-31 ENCOUNTER — Telehealth: Payer: Self-pay | Admitting: Internal Medicine

## 2013-08-31 NOTE — Telephone Encounter (Signed)
Spoke with patient and she states she can do 09/07/13 at [facility] . Scheduled her at 3:30 PM.

## 2013-08-31 NOTE — Telephone Encounter (Signed)
Dr. Edward Jolly,  Patient calling with questions regarding future appointments.  Dr. Juanda Chance would like her to have colonoscopy done while she is on her period, patient states that insurance will not cover colonoscopy done at the hospital (as those are the dates that Dr. Juanda Chance has open while patient will be on her period) but will cover outpatient colonoscopy done at Dr. Regino Schultze office but she will not be on her period during the available dates that Dr. Regino Schultze office can do procedure.   Patient wants to know, should she cancel U/S that is scheduled here on 10/16? Can she wait to schedule anything until she has an appointment for outpatient endoscopy at the same time as her period?  She would like to know your opinion. States you can email her on mychart as well with your suggestions.

## 2013-08-31 NOTE — Telephone Encounter (Signed)
Spoke with patient and she has talked with her insurance company and her cost for the colonoscopy will be much higher at the hospital. She is asking if she can move the colonoscopy to Thursday 09/15/13 at [facility]. She states she thinks she will be on her period at that time also. Please, advise.

## 2013-08-31 NOTE — Telephone Encounter (Signed)
Yes, let's do it in [facility]/

## 2013-08-31 NOTE — Telephone Encounter (Signed)
I responded through My Chart.  I recommended keeping her ultrasound appointment here in October.   I suggested colonoscopy during her cycle is best.  I shared two options for the colonoscopy due to the scheduling and insurance issues: - schedule outside of the time of her menses with Dr. Juanda Chance. - ask to be referred to one of Dr. Regino Schultze colleagues to do it while she is on her cycle.  Thanks.

## 2013-09-01 ENCOUNTER — Other Ambulatory Visit: Payer: Self-pay | Admitting: *Deleted

## 2013-09-01 ENCOUNTER — Encounter: Payer: Self-pay | Admitting: Internal Medicine

## 2013-09-01 ENCOUNTER — Telehealth: Payer: Self-pay | Admitting: Internal Medicine

## 2013-09-01 ENCOUNTER — Encounter: Payer: Self-pay | Admitting: Obstetrics and Gynecology

## 2013-09-01 DIAGNOSIS — R109 Unspecified abdominal pain: Secondary | ICD-10-CM

## 2013-09-01 NOTE — Telephone Encounter (Signed)
Spoke with patient and moved procedure to 8:00 AM on 09/07/13. Patient will come to sign paper work and have her labs.

## 2013-09-01 NOTE — Telephone Encounter (Signed)
Spoke with patient. She will read mychart message and attempt to re-schedule colonoscopy.

## 2013-09-06 ENCOUNTER — Other Ambulatory Visit (INDEPENDENT_AMBULATORY_CARE_PROVIDER_SITE_OTHER): Payer: BC Managed Care – PPO

## 2013-09-06 DIAGNOSIS — R198 Other specified symptoms and signs involving the digestive system and abdomen: Secondary | ICD-10-CM

## 2013-09-06 DIAGNOSIS — R1031 Right lower quadrant pain: Secondary | ICD-10-CM

## 2013-09-06 LAB — CBC WITH DIFFERENTIAL/PLATELET
Eosinophils Relative: 0.6 % (ref 0.0–5.0)
Lymphocytes Relative: 20.8 % (ref 12.0–46.0)
MCHC: 34.1 g/dL (ref 30.0–36.0)
Monocytes Relative: 4.9 % (ref 3.0–12.0)
Neutrophils Relative %: 73.4 % (ref 43.0–77.0)
Platelets: 192 10*3/uL (ref 150.0–400.0)
RDW: 12.7 % (ref 11.5–14.6)
WBC: 5.5 10*3/uL (ref 4.5–10.5)

## 2013-09-06 LAB — SEDIMENTATION RATE: Sed Rate: 16 mm/hr (ref 0–22)

## 2013-09-07 ENCOUNTER — Encounter: Payer: Self-pay | Admitting: Internal Medicine

## 2013-09-07 ENCOUNTER — Ambulatory Visit (AMBULATORY_SURGERY_CENTER): Payer: BC Managed Care – PPO | Admitting: Internal Medicine

## 2013-09-07 ENCOUNTER — Encounter: Payer: BC Managed Care – PPO | Admitting: Internal Medicine

## 2013-09-07 VITALS — BP 98/68 | HR 51 | Temp 98.5°F | Resp 22 | Ht 64.5 in | Wt 149.0 lb

## 2013-09-07 DIAGNOSIS — R1031 Right lower quadrant pain: Secondary | ICD-10-CM

## 2013-09-07 DIAGNOSIS — D126 Benign neoplasm of colon, unspecified: Secondary | ICD-10-CM

## 2013-09-07 MED ORDER — SODIUM CHLORIDE 0.9 % IV SOLN: 500.0000 mL | INTRAVENOUS | Status: DC

## 2013-09-07 NOTE — Progress Notes (Signed)
Called to room to assist during endoscopic procedure.  Patient ID and intended procedure confirmed with present staff. Received instructions for my participation in the procedure from the performing physician.  

## 2013-09-07 NOTE — Op Note (Signed)
[facility] [address] [place], [address]   COLONOSCOPY PROCEDURE REPORT  PATIENT: Beth, Huerta  MR#: [id number] BIRTHDATE: 01/23/1986 , 27  yrs. old GENDER: Female ENDOSCOPIST: Hart Carwin, MD REFERRED NW:GNFAO Ananias Pilgrim, M.D. PROCEDURE DATE:  09/07/2013 PROCEDURE:   Colonoscopy, diagnostic First Screening Colonoscopy - Avg.  risk and is 50 yrs.  old or older - No.  Prior Negative Screening - Now for repeat screening. N/A  History of Adenoma - Now for follow-up colonoscopy & has been > or = to 3 yrs.  N/A  Polyps Removed Today? No.  Recommend repeat exam, <10 yrs? No. ASA CLASS:   Class I INDICATIONS:abdominal pain in the lower right quadrant.  , suspected endometriosis, pain  during menses MEDICATIONS: MAC sedation, administered by CRNA and propofol (Diprivan) 300mg  IV  DESCRIPTION OF PROCEDURE:   After the risks benefits and alternatives of the procedure were thoroughly explained, informed consent was obtained.  A digital rectal exam revealed no abnormalities of the rectum.   The LB [id number]  endoscope was introduced through the anus and advanced to the cecum, which was identified by both the appendix and ileocecal valve. No adverse events experienced.   The quality of the prep was excellent, using MoviPrep  The instrument was then slowly withdrawn as the colon was fully examined.      COLON FINDINGS: A normal appearing cecum, ileocecal valve, and appendiceal orifice were identified.  The ascending, hepatic flexure, transverse, splenic flexure, descending, sigmoid colon and rectum appeared unremarkable.  No polyps or cancers were seen. Multiple random biopsies of the area were performed.  Retroflexed views revealed no abnormalities. The time to cecum=2 minutes 40 seconds.  Withdrawal time=10 minutes 9 seconds.  The scope was withdrawn and the procedure completed. COMPLICATIONS: There were no complications.  ENDOSCOPIC  IMPRESSION: Normal colon; multiple random biopsies of the area were performed , no evidence of endometriosis, cecal pouch appears normal, no extrinsic pressure or infiltration,  RECOMMENDATIONS: 1.  Await biopsy results 2.  High fiber diet 3.   discuss laxative regimen for constipation - Miralax 9-17 gms,daily during period, additional Mag Oxide 500mg  prn   eSigned:  Hart Carwin, MD 09/07/2013 9:06 AM   cc:   PATIENT NAME:  Beth, Huerta MR#: [id number]

## 2013-09-07 NOTE — Patient Instructions (Signed)

## 2013-09-07 NOTE — Progress Notes (Signed)
Lidocaine-40mg IV prior to Propofol InductionPropofol given over incremental dosages 

## 2013-09-07 NOTE — Progress Notes (Signed)
Patient did not experience any of the following events: a burn prior to discharge; a fall within the facility; wrong site/side/patient/procedure/implant event; or a hospital transfer or hospital admission upon discharge from the facility. Patient did not have preoperative order for IV antibiotic SSI prophylaxis. ([id number])Patient did not have preoperative order for IV antibiotic SSI prophylaxis. [phone])

## 2013-09-08 ENCOUNTER — Telehealth: Payer: Self-pay | Admitting: *Deleted

## 2013-09-08 NOTE — Telephone Encounter (Signed)
No answer. Number identifier. Message left to call if questions or concerns. 

## 2013-09-12 ENCOUNTER — Encounter (HOSPITAL_COMMUNITY): Payer: Self-pay

## 2013-09-12 ENCOUNTER — Telehealth: Payer: Self-pay | Admitting: Obstetrics and Gynecology

## 2013-09-12 ENCOUNTER — Ambulatory Visit (HOSPITAL_COMMUNITY): Admit: 2013-09-12 | Payer: BC Managed Care – PPO | Admitting: Internal Medicine

## 2013-09-12 ENCOUNTER — Encounter: Payer: Self-pay | Admitting: Internal Medicine

## 2013-09-12 SURGERY — COLONOSCOPY
Anesthesia: Moderate Sedation

## 2013-09-12 NOTE — Telephone Encounter (Signed)
LVM advising $25 copay due at [facility]. And patient is responsible for paying $50 bal as well. Total amount due is $75

## 2013-09-12 NOTE — Telephone Encounter (Signed)
Patient called back, we discussed her balance. Patient is agreeable and will pay on 10/16

## 2013-09-15 ENCOUNTER — Ambulatory Visit (INDEPENDENT_AMBULATORY_CARE_PROVIDER_SITE_OTHER): Payer: BC Managed Care – PPO

## 2013-09-15 ENCOUNTER — Encounter: Payer: Self-pay | Admitting: Obstetrics and Gynecology

## 2013-09-15 ENCOUNTER — Ambulatory Visit (INDEPENDENT_AMBULATORY_CARE_PROVIDER_SITE_OTHER): Payer: BC Managed Care – PPO | Admitting: Obstetrics and Gynecology

## 2013-09-15 VITALS — BP 120/64 | HR 60 | Ht 64.5 in | Wt 148.5 lb

## 2013-09-15 DIAGNOSIS — N83202 Unspecified ovarian cyst, left side: Secondary | ICD-10-CM

## 2013-09-15 DIAGNOSIS — N946 Dysmenorrhea, unspecified: Secondary | ICD-10-CM

## 2013-09-15 DIAGNOSIS — IMO0002 Reserved for concepts with insufficient information to code with codable children: Secondary | ICD-10-CM

## 2013-09-15 DIAGNOSIS — N83209 Unspecified ovarian cyst, unspecified side: Secondary | ICD-10-CM

## 2013-09-15 NOTE — Progress Notes (Signed)
Subjective  Patient is here for a follow up ultrasound to recheck a possible endometrioma of the left ovary. Patient's mother is present as well.   Long standing dysmenorrhea. Patient is having dysparuenia and bleeding with intercourse.   She believes something is wrong and wants surgical evaluation and treatment.   On NuvaRing for the last 3 - 4 months. Does not have a menses every month. Menstruation is lighter with the Ring but still painful. Pain is midline and lower abdominal. Notes spasm like pain.  Had colonoscopy with Dr. Juanda Chance.  No visible lesions of endometriosis.   Last pap was in February or March in [place] and was normal.  Objective  See ultrasound below - persistent and slightly larger complex left ovarian cyst, 3.5 cm suspicious for an endometrioma, normal right ovary and normal uterus.      Assessment  Dysmenorrhea Dyspareunia Post coital bleeding.  Complex left adnexal mass, possible endometrioma.  Plan  Proceed with laparoscopy with left ovarian cystectomy, possible left salpingo-oophorectomy, possible adhesiolysis and treatment of endometriosis.  I have reviewed benefits and risks, the later of which include bleeding, infection, incisional hernias, peripheral neuropathies, damage to surrounding organs, DVT, PE, death, need for reoperation. Patient and mother understand that the surgery is not a guarantee of resolution of pelvic pain. They also understand that further treatment may be indicated to treat endometriosis and pain.   Patient wishes to proceed.

## 2013-09-15 NOTE — Patient Instructions (Signed)
Diagnostic Laparoscopy Laparoscopy is a surgical procedure. It is used to diagnose and treat diseases inside the belly(abdomen). It is usually a brief, common, and relatively simple procedure. The laparoscopeis a thin, lighted, pencil-sized instrument. It is like a telescope. It is inserted into your abdomen through a small cut (incision). Your caregiver can look at the organs inside your body through this instrument. He or she can see if there is anything abnormal. Laparoscopy can be done either in a hospital or outpatient clinic. You may be given a mild sedative to help you relax before the procedure. Once in the operating room, you will be given a drug to make you sleep (general anesthesia). Laparoscopy usually lasts less than 1 hour. After the procedure, you will be monitored in a recovery area until you are stable and doing well. Once you are home, it will take 2 to 3 days to fully recover. RISKS AND COMPLICATIONS  Laparoscopy has relatively few risks. Your caregiver will discuss the risks with you before the procedure. Some problems that can occur include:  Infection.  Bleeding.  Damage to other organs.  Anesthetic side effects. PROCEDURE Once you receive anesthesia, your surgeon inflates the abdomen with a harmless gas (carbon dioxide). This makes the organs easier to see. The laparoscope is inserted into the abdomen through a small incision. This allows your surgeon to see into the abdomen. Other small instruments are also inserted into the abdomen through other small openings. Many surgeons attach a video camera to the laparoscope to enlarge the view. During a diagnostic laparoscopy, the surgeon may be looking for inflammation, infection, or cancer. Your surgeon may take tissue samples(biopsies). The samples are sent to a specialist in looking at cells and tissue samples (pathologist). The pathologist examines them under a microscope. Biopsies can help to diagnose or confirm a  disease. AFTER THE PROCEDURE   The gas is released from inside the abdomen.  The incisions are closed with stitches (sutures). Because these incisions are small (usually less than 1/2 inch), there is usually minimal discomfort after the procedure. There may be some mild discomfort in the throat. This is from the tube placed in the throat while you were sleeping. You may have some mild abdominal discomfort. There may also be discomfort from the instrument placement incisions in the abdomen.  The recovery time is shortened as long as there are no complications.  You will rest in a recovery room until stable and doing well. As long as there are no complications, you may be allowed to go home. FINDING OUT THE RESULTS OF YOUR TEST Not all test results are available during your visit. If your test results are not back during the visit, make an appointment with your caregiver to find out the results. Do not assume everything is normal if you have not heard from your caregiver or the medical facility. It is important for you to follow up on all of your test results. HOME CARE INSTRUCTIONS   Take all medicines as directed.  Only take over-the-counter or prescription medicines for pain, discomfort, or fever as directed by your caregiver.  Resume daily activities as directed.  Showers are preferred over baths.  You may resume sexual activities in 1 week or as directed.  Do not drive while taking narcotics. SEEK MEDICAL CARE IF:   There is increasing abdominal pain.  There is new pain in the shoulders (shoulder strap areas).  You feel lightheaded or faint.  You have the chills.  You or your   child has an oral temperature above 102 F (38.9 C).  There is pus-like (purulent) drainage from any of the wounds.  You are unable to pass gas or have a bowel movement.  You feel sick to your stomach (nauseous) or throw up (vomit). MAKE SURE YOU:   Understand these instructions.  Will watch  your condition.  Will get help right away if you are not doing well or get worse. Document Released: 02/23/2001 Document Revised: 02/09/2012 Document Reviewed: 11/17/2007 [employer] Patient Information 2014 [employer].  

## 2013-09-16 DIAGNOSIS — N83202 Unspecified ovarian cyst, left side: Secondary | ICD-10-CM | POA: Insufficient documentation

## 2013-09-20 ENCOUNTER — Telehealth: Payer: Self-pay | Admitting: Obstetrics and Gynecology

## 2013-09-20 NOTE — Telephone Encounter (Signed)
Spoke with patient in regards to her surgical benefits. She is going to think about it and call back tomorrow with her decision.

## 2013-09-21 ENCOUNTER — Other Ambulatory Visit: Payer: Self-pay | Admitting: Obstetrics and Gynecology

## 2013-09-21 DIAGNOSIS — N83202 Unspecified ovarian cyst, left side: Secondary | ICD-10-CM

## 2013-09-21 NOTE — Telephone Encounter (Signed)
Beth Huerta,  Please contact this patient back to schedule follow up for after the new year.  She likely has an endometrioma that will need follow up by ultrasound next year.  I will place an order for this.  At a minimum, I would recommend a repeat pelvic ultrasound in three months.  Thanks!

## 2013-09-21 NOTE — Telephone Encounter (Signed)
Patient called back with her decision. Patient would like to hold off on this for now due to the cost. She is looking into new insurance for next year. Patient would like to hold off for now.

## 2013-10-06 ENCOUNTER — Other Ambulatory Visit: Payer: Self-pay

## 2013-10-07 NOTE — Telephone Encounter (Signed)
Patient is asking to talk with Carolynn. °

## 2013-10-13 NOTE — Telephone Encounter (Signed)
Spoke with patient on 11/7. Patient received a letter from her insurance company stating that she had been placed on a pre-existing waiting period and wanted to let us know. Based on our records her account is up to date and claims have been paid.

## 2013-11-09 ENCOUNTER — Telehealth: Payer: Self-pay | Admitting: *Deleted

## 2013-11-09 NOTE — Telephone Encounter (Signed)
Thank you for assisting in the care of this patient!

## 2013-11-09 NOTE — Telephone Encounter (Signed)
See next phone note.

## 2013-11-09 NOTE — Telephone Encounter (Signed)
Patient returns call.  She states she just received info on new insurance benefits for next year and will be able to plan surgery next year. Advised we should start with repeat PUS to recheck ovary and then Dr Edward Jolly can make recommendation on proceeding.  PUS scheduled for 12-08-13.  Patient to call with new insurance info as soon as received so PUS can be precerted.  Routing to provider for final review. Patient agreeable to disposition. Will close encounter

## 2013-11-09 NOTE — Telephone Encounter (Signed)
Call to patient for status update and to advise of Dr Rica Records recommendation for 3 month follow-up ultrasound.  LMTCB.

## 2013-11-16 ENCOUNTER — Telehealth: Payer: Self-pay | Admitting: Obstetrics and Gynecology

## 2013-11-16 NOTE — Telephone Encounter (Signed)
LMTCB ..called patient to inform of benefits

## 2013-12-08 ENCOUNTER — Ambulatory Visit (INDEPENDENT_AMBULATORY_CARE_PROVIDER_SITE_OTHER): Payer: BC Managed Care – PPO

## 2013-12-08 ENCOUNTER — Encounter: Payer: Self-pay | Admitting: Obstetrics and Gynecology

## 2013-12-08 ENCOUNTER — Ambulatory Visit (INDEPENDENT_AMBULATORY_CARE_PROVIDER_SITE_OTHER): Payer: BC Managed Care – PPO | Admitting: Obstetrics and Gynecology

## 2013-12-08 VITALS — BP 100/66 | HR 66 | Ht 64.5 in | Wt 154.0 lb

## 2013-12-08 DIAGNOSIS — N83202 Unspecified ovarian cyst, left side: Secondary | ICD-10-CM

## 2013-12-08 DIAGNOSIS — M25559 Pain in unspecified hip: Secondary | ICD-10-CM

## 2013-12-08 DIAGNOSIS — N83209 Unspecified ovarian cyst, unspecified side: Secondary | ICD-10-CM

## 2013-12-08 DIAGNOSIS — R5383 Other fatigue: Secondary | ICD-10-CM

## 2013-12-08 DIAGNOSIS — R5381 Other malaise: Secondary | ICD-10-CM

## 2013-12-08 LAB — POCT URINE PREGNANCY: Preg Test, Ur: NEGATIVE

## 2013-12-08 NOTE — Patient Instructions (Signed)
We will contact your insurance company and proceed forward with surgical planning.

## 2013-12-08 NOTE — Progress Notes (Signed)
Subjective  LMP 11/04/13  Patient is here for a follow up ultrasound of possible left ovarian endometrium.  Patient is having constipation the week prior to menses. Feels rectal pain.  Feels nauseous all the time now.  Cramps are not terrible.  Exhausted.   Still on the NuvaRing continuously for the most part.   Was out for 2 weeks in the Fall 2014 when she had colonoscopy.   Uncertain if wants future childbearing.  Wants to proceed with laparoscopy.  Objective    BP 100/66   P 66 Weight 154#  See ultrasound below - unchanged left ovarian cyst with internal echoes and echogenic focus suspicious for an endometrioma.    Assessment  Pelvic pain. Left ovarian cyst - possible endometrioma. Fatigue.  Plan  Proceed with laparoscopy with left ovarian cystectomy and lysis of adhesions and treatment of endometriosis.  I discussed rare possibility of oophorectomy and laparotomy for completion of the procedure.  Declines robotic approach. Do GC/CT and UPT today.

## 2013-12-09 LAB — GC/CHLAMYDIA PROBE AMP, URINE
Chlamydia, Swab/Urine, PCR: NEGATIVE
GC Probe Amp, Urine: NEGATIVE

## 2013-12-12 ENCOUNTER — Telehealth: Payer: Self-pay | Admitting: Obstetrics and Gynecology

## 2013-12-12 NOTE — Telephone Encounter (Signed)
Called patient to advise that per [facility], her liability for the physicians fees for her surgery will be $1610.70. She states that she signed up for a medical plan with a $500 deductible, not the $3500 deductible that was quoted today. She states that she will contact [employer] to ensure that she is enrolled in the correct plan and will call me back with any updated information.//ssf

## 2013-12-13 NOTE — Telephone Encounter (Signed)
Returned call to patient. She provided current insurance information. Advised that I will verify new coverage and call her with her liability for the surgery//ssf

## 2013-12-13 NOTE — Telephone Encounter (Signed)
Called patient to advise of patient liability for surgery per new benefits verification...$722.14//patient paid

## 2013-12-16 ENCOUNTER — Telehealth: Payer: Self-pay | Admitting: *Deleted

## 2013-12-16 NOTE — Telephone Encounter (Signed)
Call to patient to check on date preferences for surgery. LMTCB.

## 2013-12-16 NOTE — Telephone Encounter (Signed)
Patient returned call and I was not available.  She left message that she would take first available date.  Case request sent and will call her back once scheduled.

## 2013-12-21 NOTE — Telephone Encounter (Signed)
Spoke with patient. Advised of surgery date scheduled for 01/24/14 at [place] she would receive phone call from hospital to set up pre-op appointment and our office would call back with further instructions. Patient agreeable.

## 2013-12-21 NOTE — Telephone Encounter (Signed)
Patient calling to check the status of her surgery being scheduled

## 2014-01-02 NOTE — Telephone Encounter (Signed)
Routing to provider for final review. Patient agreeable to disposition. Will close encounter.     

## 2014-01-02 NOTE — Telephone Encounter (Signed)
Spoke with pt about making pre-op appt and post op appt. Surgery instructions given and copy mailed to pt.

## 2014-01-12 ENCOUNTER — Ambulatory Visit (INDEPENDENT_AMBULATORY_CARE_PROVIDER_SITE_OTHER): Payer: BC Managed Care – PPO | Admitting: Obstetrics and Gynecology

## 2014-01-12 ENCOUNTER — Encounter: Payer: Self-pay | Admitting: Obstetrics and Gynecology

## 2014-01-12 VITALS — BP 100/72 | HR 91 | Resp 18 | Wt 154.0 lb

## 2014-01-12 DIAGNOSIS — N83209 Unspecified ovarian cyst, unspecified side: Secondary | ICD-10-CM

## 2014-01-12 DIAGNOSIS — N83202 Unspecified ovarian cyst, left side: Secondary | ICD-10-CM

## 2014-01-12 DIAGNOSIS — N946 Dysmenorrhea, unspecified: Secondary | ICD-10-CM

## 2014-01-12 MED ORDER — OXYCODONE-ACETAMINOPHEN 5-325 MG PO TABS: 2.0000 | ORAL_TABLET | ORAL | Status: DC | PRN

## 2014-01-12 NOTE — Patient Instructions (Signed)
Please take the Magnesium citrate one bottle starting at 11:00 am the day prior to surgery. The day prior to surgery, you will take only clear liquids by mouth.    I will see you the morning of surgery!

## 2014-01-12 NOTE — H&P (Signed)
Patient ID: Beth Huerta, female   DOB: 09/26/1986, 28 y.o.   MRN: [id number]  GYNECOLOGY PROBLEM VISIT  PCP:   Referring provider:   HPI: 28 y.o.   Married  Caucasian  female   G0P0000 with Patient's last menstrual period was 01/08/2014.   here for discussion of surgery. Has a persistent left ovarian cyst suspicious for an endometrioma 2.9 x 3.0 cm.  Last confirmed on ultrasound on 12/08/13. Having shooting pain on the right during menses for the first couple of days.  Has rectal pain. Colonoscopy done.  No endometriosis seen. On NuvaRing.   GYNECOLOGIC HISTORY: Patient's last menstrual period was 01/08/2014. Sexually active:  yes Partner preference:  female Contraception:  On NuvaRing.   Menopausal hormone therapy:   NA DES exposure:  NA Blood transfusions:  No Sexually transmitted diseases:  No GYN Procedures:   Colposcopy for abnormal pap.  Diagnosis CIN 2.  Mammogram:  NA             Pap: February or March 2014 - WNL.  History of abnormal pap smear:  Yes.  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    OB History   Grav Para Term Preterm Abortions TAB SAB Ect Mult Living   0 0 0 0 0 0 0 0 0 0          Family History  Problem Relation Age of Onset  . Ovarian cancer Maternal Grandmother   . Heart disease Paternal Grandfather   . Lung cancer Father   . Ovarian cancer Maternal Aunt   . Colon cancer Maternal Grandmother     Patient Active Problem List   Diagnosis Date Noted  . Left ovarian cyst 09/16/2013    Past Medical History  Diagnosis Date  . Kidney stones     rt with lithotripsy  . Depression     and anxiety  . 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 Colonic dysmotility     small bowel  . Anxiety     Past Surgical History  Procedure Laterality Date  . Tonsillectomy    . Colposcopy      CIN II    ALLERGIES: Review of patient's allergies indicates no known allergies.  Current Outpatient Prescriptions  Medication Sig Dispense Refill  .  Acetaminophen (TYLENOL PO) Take by mouth as needed.      . bismuth subsalicylate (PEPTO BISMOL) 262 MG/15ML suspension Take 30 mLs by mouth every 6 (six) hours as needed.      Marland Kitchen escitalopram (LEXAPRO) 10 MG tablet Take 1 tablet (10 mg total) by mouth daily.  30 tablet  8  . etonogestrel-ethinyl estradiol (NUVARING) 0.12-0.015 MG/24HR vaginal ring Insert vaginally and leave in place for 3 consecutive weeks, then remove for 1 week.  3 each  3  . hyoscyamine (LEVSIN SL) 0.125 MG SL tablet Place 1 tablet (0.125 mg total) under the tongue every 4 (four) hours as needed (colon spasms).  30 tablet  0  . IBUPROFEN PO Take by mouth as needed.       No current facility-administered medications for this visit.     ROS:  Pertinent items are noted in HPI.  SOCIAL HISTORY:  Married.   PHYSICAL EXAMINATION:    BP 100/72  Pulse 91  Resp 18  Wt 154 lb (69.854 kg)  LMP 01/08/2014   Wt Readings from Last 3 Encounters:  01/12/14 154 lb (69.854 kg)  12/08/13 154 lb (69.854 kg)  09/15/13 148 lb  8 oz (67.359 kg)     Ht Readings from Last 3 Encounters:  12/08/13 5' 4.5" (1.638 m)  09/15/13 5' 4.5" (1.638 m)  09/07/13 5' 4.5" (1.638 m)    General appearance: alert, cooperative and appears stated age Head: Normocephalic, without obvious abnormality, atraumatic Neck: no adenopathy, supple, symmetrical, trachea midline and thyroid not enlarged, symmetric, no tenderness/mass/nodules Lungs: clear to auscultation bilaterally Breasts: Inspection negative, No nipple retraction or dimpling, No nipple discharge or bleeding, No axillary or supraclavicular adenopathy, Normal to palpation without dominant masses Heart: regular rate and rhythm Abdomen: soft, non-tender; no masses,  no organomegaly Extremities: extremities normal, atraumatic, no cyanosis or edema Skin: Skin color, texture, turgor normal. No rashes or lesions Lymph nodes: Cervical, supraclavicular, and axillary nodes normal. No abnormal inguinal  nodes palpated Neurologic: Grossly normal  Pelvic: External genitalia:  no lesions              Urethra:  normal appearing urethra with no masses, tenderness or lesions              Bartholins and Skenes: normal                 Vagina: normal appearing vagina with normal color and discharge, no lesions              Cervix: normal appearance              Pap and high risk HPV testing done: no.            Bimanual Exam:  Uterus:  uterus is normal size, shape, consistency and nontender                                      Adnexa: normal adnexa in size, nontender and no masses                                       ASSESSMENT  Left ovarian cyst.  Suspect endometrioma. Dysmenorrhea.  PLAN  Proceed with laparoscopy with left ovarian cystectomy, possible left salpingo-oophorectomy, treatment of endometriosis.  Risks, benefits and alternatives discussed with the patient who wishes to proceed.  OK for laparotomy if necessary for completion of the surgery.  Patient will do Magnesium citrate bowel prep the day prior to surgery and will do clear liquid diet the day prior to surgery as well.  OK to continue with the NuvaRing.  Return for post op visit following surgery.    An After Visit Summary was printed and given to the patient.  25 minutes face to face time of which over 505 was spent in counseling.

## 2014-01-17 ENCOUNTER — Encounter (HOSPITAL_COMMUNITY): Payer: Self-pay | Admitting: Pharmacist

## 2014-01-24 ENCOUNTER — Encounter (HOSPITAL_COMMUNITY)
Admission: RE | Disposition: A | Discharge status: Home / Self Care | Payer: Self-pay | Source: Ambulatory Visit | Attending: Obstetrics and Gynecology

## 2014-01-24 ENCOUNTER — Telehealth: Payer: Self-pay | Admitting: Obstetrics and Gynecology

## 2014-01-24 ENCOUNTER — Encounter (HOSPITAL_COMMUNITY): Payer: BC Managed Care – PPO | Admitting: Anesthesiology

## 2014-01-24 ENCOUNTER — Encounter (HOSPITAL_COMMUNITY): Payer: Self-pay | Admitting: Anesthesiology

## 2014-01-24 ENCOUNTER — Ambulatory Visit (HOSPITAL_COMMUNITY): Payer: BC Managed Care – PPO | Admitting: Anesthesiology

## 2014-01-24 ENCOUNTER — Ambulatory Visit (HOSPITAL_COMMUNITY)
Admission: RE | Admit: 2014-01-24 | Discharge: 2014-01-24 | Disposition: A | Discharge status: Home / Self Care | Payer: BC Managed Care – PPO | Source: Ambulatory Visit | Attending: Obstetrics and Gynecology | Admitting: Obstetrics and Gynecology

## 2014-01-24 DIAGNOSIS — N946 Dysmenorrhea, unspecified: Secondary | ICD-10-CM

## 2014-01-24 DIAGNOSIS — N9489 Other specified conditions associated with female genital organs and menstrual cycle: Secondary | ICD-10-CM

## 2014-01-24 DIAGNOSIS — N803 Endometriosis of pelvic peritoneum, unspecified: Secondary | ICD-10-CM

## 2014-01-24 DIAGNOSIS — N8000 Endometriosis of the uterus, unspecified: Secondary | ICD-10-CM

## 2014-01-24 DIAGNOSIS — N289 Disorder of kidney and ureter, unspecified: Secondary | ICD-10-CM | POA: Insufficient documentation

## 2014-01-24 DIAGNOSIS — N80109 Endometriosis of ovary, unspecified side, unspecified depth: Secondary | ICD-10-CM | POA: Insufficient documentation

## 2014-01-24 DIAGNOSIS — F341 Dysthymic disorder: Secondary | ICD-10-CM | POA: Insufficient documentation

## 2014-01-24 DIAGNOSIS — N83209 Unspecified ovarian cyst, unspecified side: Secondary | ICD-10-CM | POA: Insufficient documentation

## 2014-01-24 DIAGNOSIS — N949 Unspecified condition associated with female genital organs and menstrual cycle: Secondary | ICD-10-CM | POA: Insufficient documentation

## 2014-01-24 DIAGNOSIS — N801 Endometriosis of ovary: Secondary | ICD-10-CM | POA: Insufficient documentation

## 2014-01-24 HISTORY — PX: LYSIS OF ADHESION: SHX5961

## 2014-01-24 HISTORY — PX: LAPAROSCOPIC OVARIAN CYSTECTOMY: SHX6248

## 2014-01-24 LAB — CBC
HCT: 36.7 % (ref 36.0–46.0)
HEMOGLOBIN: 12.6 g/dL (ref 12.0–15.0)
MCH: 30.7 pg (ref 26.0–34.0)
MCHC: 34.3 g/dL (ref 30.0–36.0)
MCV: 89.5 fL (ref 78.0–100.0)
Platelets: 186 10*3/uL (ref 150–400)
RBC: 4.1 MIL/uL (ref 3.87–5.11)
RDW: 11.7 % (ref 11.5–15.5)
WBC: 6.2 10*3/uL (ref 4.0–10.5)

## 2014-01-24 LAB — PREGNANCY, URINE: Preg Test, Ur: NEGATIVE

## 2014-01-24 SURGERY — EXCISION, CYST, OVARY, LAPAROSCOPIC
Anesthesia: General | Site: Abdomen

## 2014-01-24 MED ORDER — HEPARIN SODIUM (PORCINE) 5000 UNIT/ML IJ SOLN
INTRAMUSCULAR | Status: AC
  Filled 2014-01-24: qty 1

## 2014-01-24 MED ORDER — FENTANYL CITRATE 0.05 MG/ML IJ SOLN
INTRAMUSCULAR | Status: AC
  Administered 2014-01-24: 50 ug via INTRAVENOUS
  Filled 2014-01-24: qty 2

## 2014-01-24 MED ORDER — LACTATED RINGERS IR SOLN
Status: DC | PRN
  Administered 2014-01-24: 3000 mL

## 2014-01-24 MED ORDER — KETOROLAC TROMETHAMINE 30 MG/ML IJ SOLN
INTRAMUSCULAR | Status: DC | PRN
  Administered 2014-01-24: 30 mg via INTRAVENOUS

## 2014-01-24 MED ORDER — FENTANYL CITRATE 0.05 MG/ML IJ SOLN
25.0000 ug | INTRAMUSCULAR | Status: DC | PRN
  Administered 2014-01-24 (×2): 50 ug via INTRAVENOUS

## 2014-01-24 MED ORDER — METOCLOPRAMIDE HCL 5 MG/ML IJ SOLN: 10.0000 mg | Freq: Once | INTRAMUSCULAR | Status: DC | PRN

## 2014-01-24 MED ORDER — BUPIVACAINE HCL (PF) 0.25 % IJ SOLN
INTRAMUSCULAR | Status: DC | PRN
  Administered 2014-01-24: 5 mL

## 2014-01-24 MED ORDER — GLYCOPYRROLATE 0.2 MG/ML IJ SOLN
INTRAMUSCULAR | Status: AC
  Filled 2014-01-24: qty 1

## 2014-01-24 MED ORDER — ROCURONIUM BROMIDE 100 MG/10ML IV SOLN
INTRAVENOUS | Status: DC | PRN
  Administered 2014-01-24: 10 mg via INTRAVENOUS
  Administered 2014-01-24: 40 mg via INTRAVENOUS

## 2014-01-24 MED ORDER — SCOPOLAMINE 1 MG/3DAYS TD PT72
MEDICATED_PATCH | TRANSDERMAL | Status: AC
  Filled 2014-01-24: qty 1

## 2014-01-24 MED ORDER — NEOSTIGMINE METHYLSULFATE 1 MG/ML IJ SOLN
INTRAMUSCULAR | Status: DC | PRN
  Administered 2014-01-24: 3 mg via INTRAVENOUS

## 2014-01-24 MED ORDER — GLYCOPYRROLATE 0.2 MG/ML IJ SOLN
INTRAMUSCULAR | Status: DC | PRN
  Administered 2014-01-24: 0.3 mg via INTRAVENOUS
  Administered 2014-01-24: 0.6 mg via INTRAVENOUS

## 2014-01-24 MED ORDER — LACTATED RINGERS IV SOLN
INTRAVENOUS | Status: DC
  Administered 2014-01-24 (×3): via INTRAVENOUS

## 2014-01-24 MED ORDER — LIDOCAINE HCL (CARDIAC) 20 MG/ML IV SOLN
INTRAVENOUS | Status: AC
  Filled 2014-01-24: qty 5

## 2014-01-24 MED ORDER — OXYCODONE-ACETAMINOPHEN 5-325 MG PO TABS
1.0000 | ORAL_TABLET | Freq: Once | ORAL | Status: AC
  Administered 2014-01-24: 1 via ORAL

## 2014-01-24 MED ORDER — GLYCOPYRROLATE 0.2 MG/ML IJ SOLN
INTRAMUSCULAR | Status: AC
  Filled 2014-01-24: qty 3

## 2014-01-24 MED ORDER — ONDANSETRON HCL 4 MG/2ML IJ SOLN
INTRAMUSCULAR | Status: DC | PRN
  Administered 2014-01-24: 4 mg via INTRAVENOUS

## 2014-01-24 MED ORDER — OXYCODONE-ACETAMINOPHEN 5-325 MG PO TABS
ORAL_TABLET | ORAL | Status: AC
Start: 2014-01-24 — End: 2014-01-24
  Administered 2014-01-24: 1 via ORAL
  Filled 2014-01-24: qty 1

## 2014-01-24 MED ORDER — ONDANSETRON HCL 4 MG/2ML IJ SOLN
INTRAMUSCULAR | Status: AC
  Filled 2014-01-24: qty 2

## 2014-01-24 MED ORDER — PROPOFOL 10 MG/ML IV BOLUS
INTRAVENOUS | Status: DC | PRN
  Administered 2014-01-24: 160 mg via INTRAVENOUS

## 2014-01-24 MED ORDER — CEFAZOLIN SODIUM-DEXTROSE 2-3 GM-% IV SOLR
2.0000 g | Freq: Once | INTRAVENOUS | Status: AC
  Administered 2014-01-24: 2 g via INTRAVENOUS

## 2014-01-24 MED ORDER — MIDAZOLAM HCL 2 MG/2ML IJ SOLN
INTRAMUSCULAR | Status: AC
  Filled 2014-01-24: qty 2

## 2014-01-24 MED ORDER — FENTANYL CITRATE 0.05 MG/ML IJ SOLN
INTRAMUSCULAR | Status: AC
  Filled 2014-01-24: qty 5

## 2014-01-24 MED ORDER — PROPOFOL 10 MG/ML IV EMUL
INTRAVENOUS | Status: AC
  Filled 2014-01-24: qty 20

## 2014-01-24 MED ORDER — LIDOCAINE HCL (CARDIAC) 20 MG/ML IV SOLN
INTRAVENOUS | Status: DC | PRN
  Administered 2014-01-24: 30 mg via INTRAVENOUS
  Administered 2014-01-24: 70 mg via INTRAVENOUS

## 2014-01-24 MED ORDER — SCOPOLAMINE 1 MG/3DAYS TD PT72
1.0000 | MEDICATED_PATCH | TRANSDERMAL | Status: DC
  Administered 2014-01-24: 1.5 mg via TRANSDERMAL

## 2014-01-24 MED ORDER — FENTANYL CITRATE 0.05 MG/ML IJ SOLN
INTRAMUSCULAR | Status: DC | PRN
  Administered 2014-01-24 (×4): 50 ug via INTRAVENOUS

## 2014-01-24 MED ORDER — NEOSTIGMINE METHYLSULFATE 1 MG/ML IJ SOLN
INTRAMUSCULAR | Status: AC
  Filled 2014-01-24: qty 1

## 2014-01-24 MED ORDER — KETOROLAC TROMETHAMINE 30 MG/ML IJ SOLN
INTRAMUSCULAR | Status: AC
  Filled 2014-01-24: qty 1

## 2014-01-24 MED ORDER — MEPERIDINE HCL 25 MG/ML IJ SOLN: 6.2500 mg | INTRAMUSCULAR | Status: DC | PRN

## 2014-01-24 MED ORDER — DEXAMETHASONE SODIUM PHOSPHATE 10 MG/ML IJ SOLN
INTRAMUSCULAR | Status: DC | PRN
  Administered 2014-01-24: 10 mg via INTRAVENOUS

## 2014-01-24 MED ORDER — MIDAZOLAM HCL 2 MG/2ML IJ SOLN
INTRAMUSCULAR | Status: DC | PRN
  Administered 2014-01-24: 2 mg via INTRAVENOUS

## 2014-01-24 MED ORDER — DEXAMETHASONE SODIUM PHOSPHATE 10 MG/ML IJ SOLN
INTRAMUSCULAR | Status: AC
  Filled 2014-01-24: qty 1

## 2014-01-24 MED ORDER — BUPIVACAINE HCL (PF) 0.25 % IJ SOLN
INTRAMUSCULAR | Status: AC
  Filled 2014-01-24: qty 30

## 2014-01-24 SURGICAL SUPPLY — 26 items
BARRIER ADHS 3X4 INTERCEED (GAUZE/BANDAGES/DRESSINGS) ×3 IMPLANT
BENZOIN TINCTURE PRP APPL 2/3 (GAUZE/BANDAGES/DRESSINGS) IMPLANT
CABLE HIGH FREQUENCY MONO STRZ (ELECTRODE) ×3 IMPLANT
CANISTER SUCT 3000ML (MISCELLANEOUS) ×3 IMPLANT
CLOTH BEACON ORANGE TIMEOUT ST (SAFETY) ×3 IMPLANT
DERMABOND ADVANCED (GAUZE/BANDAGES/DRESSINGS)
DERMABOND ADVANCED .7 DNX12 (GAUZE/BANDAGES/DRESSINGS) IMPLANT
FORCEPS CUTTING 33CM 5MM (CUTTING FORCEPS) IMPLANT
GLOVE BIO SURGEON STRL SZ 6.5 (GLOVE) ×6 IMPLANT
GOWN STRL REUS W/TWL LRG LVL3 (GOWN DISPOSABLE) ×6 IMPLANT
NS IRRIG 1000ML POUR BTL (IV SOLUTION) ×3 IMPLANT
PACK LAPAROSCOPY BASIN (CUSTOM PROCEDURE TRAY) ×3 IMPLANT
POUCH SPECIMEN RETRIEVAL 10MM (ENDOMECHANICALS) IMPLANT
PROTECTOR NERVE ULNAR (MISCELLANEOUS) ×3 IMPLANT
SCISSORS LAP 5X35 DISP (ENDOMECHANICALS) ×3 IMPLANT
SET IRRIG TUBING LAPAROSCOPIC (IRRIGATION / IRRIGATOR) ×3 IMPLANT
SOLUTION ELECTROLUBE (MISCELLANEOUS) IMPLANT
STRIP CLOSURE SKIN 1/4X3 (GAUZE/BANDAGES/DRESSINGS) IMPLANT
SUT PLAIN 3 0 FS 2 27 (SUTURE) ×3 IMPLANT
SUT VICRYL 0 UR6 27IN ABS (SUTURE) IMPLANT
SUT VICRYL 4-0 PS2 18IN ABS (SUTURE) ×3 IMPLANT
TOWEL OR 17X24 6PK STRL BLUE (TOWEL DISPOSABLE) ×6 IMPLANT
TRAY FOLEY CATH 14FR (SET/KITS/TRAYS/PACK) ×3 IMPLANT
TROCAR XCEL DIL TIP R 11M (ENDOMECHANICALS) IMPLANT
WARMER LAPAROSCOPE (MISCELLANEOUS) ×3 IMPLANT
WATER STERILE IRR 1000ML POUR (IV SOLUTION) ×3 IMPLANT

## 2014-01-24 NOTE — Progress Notes (Signed)
[name], MD at 01/12/2014  3:39 PM      Status: Signed            Patient ID: Beth Huerta, female   DOB: [birth year], 28 y.o.   MRN: [id number]  GYNECOLOGY PROBLEM VISIT  PCP:   Referring provider:   HPI: 28 y.o.   Married  Caucasian  female    G0P0000 with Patient's last menstrual period was 01/08/2014.    here for discussion of surgery. Has a persistent left ovarian cyst suspicious for an endometrioma 2.9 x 3.0 cm.  Last confirmed on ultrasound on 12/08/13. Having shooting pain on the right during menses for the first couple of days.   Has rectal pain. Colonoscopy done.  No endometriosis seen. On NuvaRing.   GYNECOLOGIC HISTORY: Patient's last menstrual period was 01/08/2014. Sexually active:  yes Partner preference:  female Contraception:  On NuvaRing.    Menopausal hormone therapy:   NA DES exposure:  NA Blood transfusions:  No Sexually transmitted diseases:  No GYN Procedures:   Colposcopy for abnormal pap.  Diagnosis CIN 2.   Mammogram:  NA              Pap: February or March 2014 - WNL.   History of abnormal pap smear:  Yes.  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      OB History     Grav  Para  Term  Preterm  Abortions  TAB  SAB  Ect  Mult  Living     0  0  0  0  0  0  0  0  0  0             Family History   Problem  Relation  Age of Onset   .  Ovarian cancer  Maternal Grandmother     .  Heart disease  Paternal Grandfather     .  Lung cancer  Father     .  Ovarian cancer  Maternal Aunt     .  Colon cancer  Maternal Grandmother         Patient Active Problem List     Diagnosis  Date Noted   .  Left ovarian cyst  09/16/2013       Past Medical History   Diagnosis  Date   .  Kidney stones         rt with lithotripsy   .  Depression         and anxiety   .  Abnormal Pap smear  10/2010       colpo/CIN II   .  Colonic dysmotility         small bowel   .  Anxiety         Past Surgical History   Procedure  Laterality  Date   .   Tonsillectomy       .  Colposcopy           CIN II     ALLERGIES: Review of patient's allergies indicates no known allergies.    Current Outpatient Prescriptions   Medication  Sig  Dispense  Refill   .  Acetaminophen (TYLENOL PO)  Take by mouth as needed.         .  bismuth subsalicylate (PEPTO BISMOL) 262 MG/15ML suspension  Take 30 mLs by mouth every 6 (six) hours as needed.         Marland Kitchen  escitalopram (LEXAPRO) 10 MG tablet  Take 1 tablet (10 mg total) by mouth daily.   30 tablet   8   .  etonogestrel-ethinyl estradiol (NUVARING) 0.12-0.015 MG/24HR vaginal ring  Insert vaginally and leave in place for 3 consecutive weeks, then remove for 1 week.   3 each   3   .  hyoscyamine (LEVSIN SL) 0.125 MG SL tablet  Place 1 tablet (0.125 mg total) under the tongue every 4 (four) hours as needed (colon spasms).   30 tablet   0   .  IBUPROFEN PO  Take by mouth as needed.            No current facility-administered medications for this visit.      ROS:  Pertinent items are noted in HPI.  SOCIAL HISTORY:  Married.   PHYSICAL EXAMINATION:    BP 100/72  Pulse 91  Resp 18  Wt 154 lb (69.854 kg)  LMP 01/08/2014    Wt Readings from Last 3 Encounters:   01/12/14  154 lb (69.854 kg)   12/08/13  154 lb (69.854 kg)   09/15/13  148 lb 8 oz (67.359 kg)       Ht Readings from Last 3 Encounters:   12/08/13  5' 4.5" (1.638 m)   09/15/13  5' 4.5" (1.638 m)   09/07/13  5' 4.5" (1.638 m)     General appearance: alert, cooperative and appears stated age Head: Normocephalic, without obvious abnormality, atraumatic Neck: no adenopathy, supple, symmetrical, trachea midline and thyroid not enlarged, symmetric, no tenderness/mass/nodules Lungs: clear to auscultation bilaterally Breasts: Inspection negative, No nipple retraction or dimpling, No nipple discharge or bleeding, No axillary or supraclavicular adenopathy, Normal to palpation without dominant masses Heart: regular rate and rhythm Abdomen: soft,  non-tender; no masses,  no organomegaly Extremities: extremities normal, atraumatic, no cyanosis or edema Skin: Skin color, texture, turgor normal. No rashes or lesions Lymph nodes: Cervical, supraclavicular, and axillary nodes normal. No abnormal inguinal nodes palpated Neurologic: Grossly normal  Pelvic: External genitalia:  no lesions              Urethra:  normal appearing urethra with no masses, tenderness or lesions              Bartholins and Skenes: normal                  Vagina: normal appearing vagina with normal color and discharge, no lesions              Cervix: normal appearance              Pap and high risk HPV testing done: no.             Bimanual Exam:  Uterus:  uterus is normal size, shape, consistency and nontender                                      Adnexa: normal adnexa in size, nontender and no masses                                        ASSESSMENT  Left ovarian cyst.  Suspect endometrioma. Dysmenorrhea.  PLAN  Proceed with laparoscopy with left ovarian cystectomy, possible left salpingo-oophorectomy, treatment of endometriosis.  Risks, benefits and alternatives discussed with the  patient who wishes to proceed.  OK for laparotomy if necessary for completion of the surgery.  Patient will do Magnesium citrate bowel prep the day prior to surgery and will do clear liquid diet the day prior to surgery as well.  OK to continue with the NuvaRing.  Return for post op visit following surgery.      An After Visit Summary was printed and given to the patient.  25 minutes face to face time of which over 505 was spent in counseling.

## 2014-01-24 NOTE — Anesthesia Preprocedure Evaluation (Addendum)
Anesthesia Evaluation  Patient identified by MRN, date of birth, ID band Patient awake    Reviewed: Allergy & Precautions, H&P , NPO status , Patient's Chart, lab work & pertinent test results  Airway Mallampati: I TM Distance: >3 FB Neck ROM: Full    Dental no notable dental hx. (+) Teeth Intact   Pulmonary neg pulmonary ROS,  breath sounds clear to auscultation  Pulmonary exam normal       Cardiovascular Rhythm:Regular Rate:Normal     Neuro/Psych PSYCHIATRIC DISORDERS Anxiety Depression negative neurological ROS     GI/Hepatic Neg liver ROS, Colonic dysmotility   Endo/Other  negative endocrine ROS  Renal/GU Renal diseaseRenal calculi  negative genitourinary   Musculoskeletal negative musculoskeletal ROS (+)   Abdominal Normal abdominal exam  (+)   Peds  Hematology negative hematology ROS (+)   Anesthesia Other Findings   Reproductive/Obstetrics Left ovarian cyst  Pelvic pain endometriosis                          Anesthesia Physical Anesthesia Plan  ASA: II  Anesthesia Plan: General   Post-op Pain Management:    Induction: Intravenous  Airway Management Planned: Oral ETT  Additional Equipment:   Intra-op Plan:   Post-operative Plan: Extubation in OR  Informed Consent: I have reviewed the patients History and Physical, chart, labs and discussed the procedure including the risks, benefits and alternatives for the proposed anesthesia with the patient or authorized representative who has indicated his/her understanding and acceptance.   Dental advisory given  Plan Discussed with: Anesthesiologist, CRNA and Surgeon  Anesthesia Plan Comments:         Anesthesia Quick Evaluation

## 2014-01-24 NOTE — Anesthesia Postprocedure Evaluation (Signed)
  Anesthesia Post-op Note  Patient: Beth Huerta  Procedure(s) Performed: Procedure(s): LAPAROSCOPIC OVARIAN LEFT CYSTECTOMY; BIOPSY UTERINE SEROSA (Left) LYSIS OF ADHESION, fulgeration of endometriosis  (N/A) Patient is awake and responsive. Pain and nausea are reasonably well controlled. Vital signs are stable and clinically acceptable. Oxygen saturation is clinically acceptable. There are no apparent anesthetic complications at this time. Patient is ready for discharge.

## 2014-01-24 NOTE — Discharge Instructions (Signed)
Diagnostic Laparoscopy Laparoscopy is a surgical procedure. It is used to diagnose and treat diseases inside the belly (abdomen). It is usually a brief, common, and relatively simple procedure. The laparoscopeis a thin, lighted, pencil-sized instrument. It is like a telescope. It is inserted into your abdomen through a small cut (incision). Your caregiver can look at the organs inside your body through this instrument. He or she can see if there is anything abnormal. Laparoscopy can be done either in a hospital or outpatient clinic. You may be given a mild sedative to help you relax before the procedure. Once in the operating room, you will be given a drug to make you sleep (general anesthesia). Laparoscopy usually lasts less than 1 hour. After the procedure, you will be monitored in a recovery area until you are stable and doing well. Once you are home, it will take 2 to 3 days to fully recover. RISKS AND COMPLICATIONS  Laparoscopy has relatively few risks. Your caregiver will discuss the risks with you before the procedure. Some problems that can occur include:  Infection.  Bleeding.  Damage to other organs.  Anesthetic side effects. PROCEDURE Once you receive anesthesia, your surgeon inflates the abdomen with a harmless gas (carbon dioxide). This makes the organs easier to see. The laparoscope is inserted into the abdomen through a small incision. This allows your surgeon to see into the abdomen. Other small instruments are also inserted into the abdomen through other small openings. Many surgeons attach a video camera to the laparoscope to enlarge the view. During a diagnostic laparoscopy, the surgeon may be looking for inflammation, infection, or cancer. Your surgeon may take tissue samples(biopsies). The samples are sent to a specialist in looking at cells and tissue samples (pathologist). The pathologist examines them under a microscope. Biopsies can help to diagnose or confirm a  disease. AFTER THE PROCEDURE   The gas is released from inside the abdomen.  The incisions are closed with stitches (sutures). Because these incisions are small (usually less than 1/2 inch), there is usually minimal discomfort after the procedure. There may be some mild discomfort in the throat. This is from the tube placed in the throat while you were sleeping. You may have some mild abdominal discomfort. There may also be discomfort from the instrument placement incisions in the abdomen.  The recovery time is shortened as long as there are no complications.  You will rest in a recovery room until stable and doing well. As long as there are no complications, you may be allowed to go home. FINDING OUT THE RESULTS OF YOUR TEST Not all test results are available during your visit. If your test results are not back during the visit, make an appointment with your caregiver to find out the results. Do not assume everything is normal if you have not heard from your caregiver or the medical facility. It is important for you to follow up on all of your test results. HOME CARE INSTRUCTIONS   Take all medicines as directed.  Only take over-the-counter or prescription medicines for pain, discomfort, or fever as directed by your caregiver.  Resume daily activities as directed.  Showers are preferred over baths.  You may resume sexual activities in 1 week or as directed.  Do not drive while taking narcotics. SEEK MEDICAL CARE IF:   There is increasing abdominal pain.  There is new pain in the shoulders (shoulder strap areas).  You feel lightheaded or faint.  You have the chills.  You  or your child has an oral temperature above 102 F (38.9 C).  There is pus-like (purulent) drainage from any of the wounds.  You are unable to pass gas or have a bowel movement.  You feel sick to your stomach (nauseous) or throw up (vomit). MAKE SURE YOU:   Understand these instructions.  Will watch  your condition.  Will get help right away if you are not doing well or get worse. Document Released: 02/23/2001 Document Revised: 03/14/2013 Document Reviewed: 11/17/2007 [facility] Patient Information 2014 [place], [place].   Endometriosis Endometriosis is a condition in which the tissue that lines the uterus (endometrium) grows outside of its normal location. The tissue may grow in many locations close to the uterus, but it commonly grows on the ovaries, fallopian tubes, vagina, or bowel. Because the uterus expels, or sheds, its lining every menstrual cycle, there is bleeding wherever the endometrial tissue is located. This can cause pain because blood is irritating to tissues not normally exposed to it.  CAUSES  The cause of endometriosis is not known.  SIGNS AND SYMPTOMS  Often, there are no symptoms. When symptoms are present, they can vary with the location of the displaced tissue. Various symptoms can occur at different times. Although symptoms occur mainly during a woman's menstrual period, they can also occur midcycle and usually stop with menopause. Some people may go months with no symptoms at all. Symptoms may include:   Back or abdominal pain.   Heavier bleeding during periods.   Pain during intercourse.   Painful bowel movements.   Infertility. DIAGNOSIS  Your health care provider will do a physical exam and ask about your symptoms. Various tests may be done, such as:   Blood tests and urine tests. These are done to help rule out other problems.   Ultrasound. This test is done to look for abnormal tissue.   An X-ray of the lower bowel (barium enema).  Laparoscopy. In this procedure, a thin, lighted tube with a tiny camera on the end (laparoscope) is inserted into your abdomen. This helps your health care provider look for abnormal tissue to confirm the diagnosis. The health care provider may also remove a small piece of tissue (biopsy) from any abnormal tissue found.  This tissue sample can then be sent to a lab so it can be looked at under a microscope. TREATMENT  Treatment will vary and may include:   Medicines to relieve pain. Nonsteroidal anti-inflammatory drugs (NSAIDs) are a type of pain medicine that can help to relieve the pain caused by endometriosis.  Hormonal therapy. When using hormonal therapy, periods are eliminated. This eliminates the monthly exposure to blood by the displaced endometrial tissue.   Surgery. Surgery may sometimes be done to remove the abnormal endometrial tissue. In severe cases, surgery may be done to remove the fallopian tubes, uterus, and ovaries (hysterectomy). HOME CARE INSTRUCTIONS   Only take over-the-counter or prescription medicines for pain, discomfort, or fever as directed by your health care provider. Do not take aspirin because it may increase bleeding when you are not on hormonal therapy.   Avoid activities that produce pain, including sexual activity. SEEK MEDICAL CARE IF:  You have pelvic pain before, after, or during your periods.  You have pelvic pain between periods that gets worse during your period.  You have pelvic pain during or after sex.  You have pelvic pain with bowel movements or urination, especially during your period.  You have problems getting pregnant. [place]  IF:   Your pain is severe and is not responding to pain medicine.   You have severe nausea and vomiting, or you cannot keep foods down.   You have pain that is limited to the right lower part of your abdomen.   You have swelling or increasing pain in your abdomen.   You see blood in your stool.   You have a fever or persistent symptoms for more than 2 3 days.   You have a fever and your symptoms suddenly get worse. MAKE SURE YOU:   Understand these instructions.  Will watch your condition.  Will get help right away if you are not doing well or get worse. Document Released: 11/14/2000  Document Revised: 09/07/2013 Document Reviewed: 07/15/2013 [facility] Patient Information 2014 [place].

## 2014-01-24 NOTE — Transfer of Care (Signed)
Immediate Anesthesia Transfer of Care Note  Patient: Beth Huerta  Procedure(s) Performed: Procedure(s): LAPAROSCOPIC OVARIAN LEFT CYSTECTOMY; BIOPSY UTERINE SEROSA (Left) LYSIS OF ADHESION, fulgeration of endometriosis  (N/A)  Patient Location: PACU  Anesthesia Type:General  Level of Consciousness: awake, sedated and patient cooperative  Airway & Oxygen Therapy: Patient Spontanous Breathing and Patient connected to nasal cannula oxygen  Post-op Assessment: Report given to PACU RN and Post -op Vital signs reviewed and stable  Post vital signs: Reviewed and stable  Complications: No apparent anesthesia complications

## 2014-01-24 NOTE — Telephone Encounter (Signed)
Call transferred to Dr. Quincy Simmonds who spoke with patient's Mother and advised to go to nearest urgent care for catheterization. They live in [place].   Routing to Dr. Quincy Simmonds for signature, will close encounter.

## 2014-01-24 NOTE — Progress Notes (Signed)
Update to History and Physical  No marked change in status since office preop visit.   Patient examined.   OK to proceed.  

## 2014-01-24 NOTE — Op Note (Signed)
NAMEDESAREA, STALTER NO.:  [id number]  MEDICAL RECORD NO.:  [id number]  LOCATION:  [facility]                          FACILITY:  [facility]  PHYSICIAN:  Lenard Galloway, M.D.   DATE OF BIRTH:  1986-10-23  DATE OF PROCEDURE:  01/24/2014 DATE OF DISCHARGE:                              OPERATIVE REPORT   PREOPERATIVE DIAGNOSIS:  Left ovarian cyst, pelvic pain.  POSTOPERATIVE DIAGNOSIS:  Left ovarian endometrioma, endometriosis.  PROCEDURE:  Laparoscopic left ovarian cystectomy, partial resection of left ovary, biopsy of uterine serosa, lysis of adhesions, fulguration of endometriosis.  SURGEON:  Lenard Galloway, MD  ASSISTANT:  Felipa Emory, MD  ANESTHESIA:  General endotracheal, local with 0.25% Marcaine.  IV FLUIDS:  2400 mL Ringer's lactate.  EBL:  20 mL.  URINE OUTPUT:  200 mL.  COMPLICATIONS:  None.  INDICATIONS FOR THE PROCEDURE:  The patient is a 28 year old gravida 0, para 69 Caucasian female, who presented with chronic dysmenorrhea, dyspareunia, rectal pain, and a persistent left ovarian cyst measuring 3 cm and which had features suspicious for an endometrioma.  The patient underwent a colonoscopy which demonstrated no signs of endometriosis. The colon was unremarkable.  The patient now presents for surgical evaluation and treatment of the suspected left endometrioma and endometriosis.  A plan is made to proceed with a laparoscopic left ovarian cystectomy with possible left salpingo-oophorectomy, lysis of adhesions, and treatment of endometriosis.  Risks, benefits, and alternatives were reviewed prior to proceeding.  FINDINGS:  Examination under anesthesia revealed a small mid position mobile uterus.  There was a very subtle fullness on the left adnexal region.  The right adnexal region appeared to be unremarkable.  Laparoscopy demonstrated a 3-cm left endometrioma which was adherent to the left pelvic sidewall just above the level of the ureter.   The endometrioma was entered during the course of the patient's surgery and drained and typical chocolate fluid noted.  The right ovary contained scattered brown lesions of endometriosis noted superficially on the ovarian cortex.  The right posterior cul-de-sac contained brownish staining and hemosiderin staining lesions of endometriosis.  There were 2 small Masters windows noted in the right posterior cul-de-sac.  These measured 1 cm in diameter and less.  There was also a Masters window measuring 1.5 cm to the left of the patient's left uterosacral ligament and the uterine artery.  The bilateral fallopian tubes were unremarkable.  There was a small polypoid lesion of the posterior uterine fundus, which was excised.  The anterior cul-de-sac was free of lesions.  The appendix, liver, and upper abdomen were unremarkable.  The gallbladder was not visualized.  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  These were lysed.  SPECIMENS:  The left ovarian endometrioma wall and portions of the excised serosa of the left ovary were sent to pathology together.  The polypoid lesion of the uterine serosa was sent to pathology separately.  PROCEDURE:  The patient was reidentified in the preoperative holding area.  She received Ancef 2 g IV for antibiotic prophylaxis, and she received both TED hose and PAS stockings for DVT prophylaxis.  In  the operating room, the patient was placed in the supine position on the operating room table and general endotracheal anesthesia was induced.  The patient was then placed in the dorsal lithotomy position. The abdomen, vagina, and perineum were sterilely prepped and draped after a Foley catheter was placed inside the vagina and a uterine manipulator was placed.  The open-sided speculum was placed in the vagina and a single-tooth tenaculum was placed on the anterior cervical lip.  This was then  replaced with a Hulka tenaculum.  The laparoscopy began by infiltrating the umbilicus with 1.61% Marcaine. A vertical incision was then created within the inferior aspect of the umbilicus.  Dissection down to the fascia was performed with an Allis clamp.  A 10-mm trocar was then inserted directly into the peritoneal cavity.  The laparoscope confirmed proper placement.  A CO2 pneumoperitoneum was achieved and the patient was then placed in the Trendelenburg position.  A 5-mm trocars were then placed in the left and right lower quadrants.  The congenital adhesions of the left pelvic sidewall to the sigmoid colon were sharply lysed at this time using the laparoscopic scissors.  There was very minimal bleeding which was treated with the monopolar energy.  Care was taken to avoid the bowel during the cauterization.  The abdominal wall was transilluminated to identify the vessels.  Local 0.25% Marcaine was then used to inject the skin in each of the right and left lower quadrants.  Incisions were created with a scalpel and then 5-mm trocars were placed under direct visualization with a  laparoscope.  An inspection of the pelvic and abdominal organs was performed and the findings are as noted above.  Blunt dissection was used to gently tease the left ovary off the left pelvic sidewall after the ureter was identified in this area.  During the course of this blunt dissection, the endometrium was entered bluntly and the chocolate fluid was expressed.  The area was then irrigated and suctioned.  The opening of the left ovarian serosa was then widened using a laparoscopic scissors. With the ovary now free enough to the left pelvic sidewall, the cyst wall was bluntly peeled from the surrounding uterine serosa.  After the specimen was freed, it was removed from the operating laparoscope which was placed through the umbilical trocar.  The specimen was set aside.  The laparoscopic scissors was then  used to resect portions of the uterine serosa which were heavily involved with endometriosis, and these tissue specimens were sent to pathology with the ovarian wall endometrioma.  A combination of monopolar cautery with the scissors and bipolar cautery with the Kleppinger forceps were then used to create hemostasis in the bed of the ovary and along the serosal edges.  This provided good hemostasis.  The pelvis was irrigated and suctioned.  There was a little bit of oozing noted just above and lateral to the left ureter and this responded to gentle cautery with the bipolar cautery forceps.  Hemostasis was then excellent.  The right ovary was grasped and the serosa was cauterized with the bipolar Kleppinger forceps.  The lesions of endometriosis in the posterior cul-de-sac were similarly cauterized with the Kleppinger forceps.  Interceed was next placed in the pelvis and was wrapped around the left ovary.  The lower abdominal trocars were removed under visualization of the laparoscope.  The laparoscope was removed and the CO2 pneumoperitoneum was released.  Manual breaths were delivered to remove remaining CO2 gas.  The umbilical trocar was then removed.  The umbilical fascia was closed with a figure-of-eight suture of 0 Vicryl.  The skin incisions were closed with a subcuticular closure of 4- 0 Vicryl.  The skin was cleansed of Betadine and then Dermabond was placed over the incisions.  The Foley catheter and the Hulka tenaculum were removed.  The patient's NuvaRing which was removed at the beginning of the procedure was replaced at this time.  The patient was awakened, extubated, and escorted to the recovery room in stable condition.  There were no complications to the procedure.  All needle, instrument, sponge counts were correct.     Lenard Galloway, M.D.     BES/MEDQ  D:  01/24/2014  T:  01/24/2014  Job:  [id number]

## 2014-01-24 NOTE — Brief Op Note (Signed)
01/24/2014  9:32 AM  PATIENT:  Baxter Flattery  28 y.o. female  PRE-OPERATIVE DIAGNOSIS:  left ovarian cyst, pelvic pain  POST-OPERATIVE DIAGNOSIS:  left ovarian cyst, pelvic pain  PROCEDURE:  Procedure(s): LAPAROSCOPIC OVARIAN LEFT CYSTECTOMY; BIOPSY UTERINE SEROSA (Left) LYSIS OF ADHESION, fulgeration of endometriosis  (N/A), Partial resection of left ovary.  SURGEON:  Surgeon(s) and Role:    * Irish Piech E Amundson de Berton Lan, MD - Primary    * Lyman Speller, MD - Assisting  PHYSICIAN ASSISTANT:   ASSISTANTS: Lyman Speller, MD   ANESTHESIA:   local and general  EBL:  Total I/O In: 2400 [I.V.:2400] Out: 220 [Urine:200; Blood:20]  BLOOD ADMINISTERED:none  DRAINS: none   LOCAL MEDICATIONS USED:  MARCAINE     SPECIMEN:  Source of Specimen:   Left ovarian wall endometrioma, uterine serosa biopsy  DISPOSITION OF SPECIMEN:  PATHOLOGY  COUNTS:  YES  TOURNIQUET:  * No tourniquets in log *  DICTATION: .Other Dictation: Dictation Number    PLAN OF CARE: Discharge to home after PACU  PATIENT DISPOSITION:  PACU - hemodynamically stable.   Delay start of Pharmacological VTE agent (>24hrs) due to surgical blood loss or risk of bleeding: not applicable

## 2014-01-24 NOTE — Telephone Encounter (Signed)
Patients mother is calling about daughter she has surgery this am and has the urge to urinate but is unable to go.

## 2014-01-25 ENCOUNTER — Encounter (HOSPITAL_COMMUNITY): Payer: Self-pay | Admitting: Obstetrics and Gynecology

## 2014-01-27 ENCOUNTER — Telehealth: Payer: Self-pay | Admitting: Obstetrics and Gynecology

## 2014-01-27 NOTE — Telephone Encounter (Signed)
Spoke with patient. She states she is having new nausea and feelings of "constant pressure on the left side" that started today. She is taking Percocet with the last dose today at 1200.  Rates pain as 4/10 with percocet. Patient is not taking Motrin, Percocet only. States she has been "up and about" but not overactive. Feels that sutures are healing well. Had to go to the ER Tuesday after surgery and had foley catheter placed, then she dc foley catheter on her own Wednesday evening. Patient denies pain with urination, denies fevers. Patient states her last BM was the day of her surgery. She states that she is feeling okay, sounds in good spirits,  But states "I just wanted to make sure everything was okay prior to the weekend". Patient lives out of the area in [place]. Advised would discuss with Dr. Quincy Simmonds and return her call with any further instructions.

## 2014-01-27 NOTE — Telephone Encounter (Signed)
Patient had surgery Tuesday with silva, Feeling nauseous  and a lot of pressure and pain in lower left side of pelvic area

## 2014-01-27 NOTE — Telephone Encounter (Signed)
Phone call personally to the patient.  Having significant left lower pelvic pressure. Some nausea today as well.  Pressure comes and goes like a gas pain, never resolves and can be sharp.  Nothing makes it better or worse. Percocet does not make it better.  No bowel movement since surgery on Tuesday.   Stomach feels swollen.  No fevers.  No dizziness.   Incisions look good per patient - less bruising. Not draining.  Had a foley catheter for 24 hours and then removed.  Straining a little to void.  Very little vaginal bleeding.   Assessment  Probable constipation.   Plan  Miralax daily.  Call for worsening symptoms. Has an appointment 02/08/14.

## 2014-02-08 ENCOUNTER — Encounter: Payer: Self-pay | Admitting: Obstetrics and Gynecology

## 2014-02-08 ENCOUNTER — Ambulatory Visit (INDEPENDENT_AMBULATORY_CARE_PROVIDER_SITE_OTHER): Payer: BC Managed Care – PPO | Admitting: Obstetrics and Gynecology

## 2014-02-08 ENCOUNTER — Telehealth: Payer: Self-pay | Admitting: Obstetrics and Gynecology

## 2014-02-08 VITALS — BP 110/60 | HR 84 | Ht 64.5 in | Wt 157.0 lb

## 2014-02-08 DIAGNOSIS — Z9889 Other specified postprocedural states: Secondary | ICD-10-CM

## 2014-02-08 MED ORDER — ETONOGESTREL-ETHINYL ESTRADIOL 0.12-0.015 MG/24HR VA RING: VAGINAL_RING | VAGINAL | Status: DC

## 2014-02-08 NOTE — Patient Instructions (Signed)
Your may use your NuvaRing continuously.  You can keep each ring in for 4 weeks at a time. This will help you to avoid a menstrual period.   I will see you in 3 months for your annual exam.

## 2014-02-08 NOTE — Telephone Encounter (Signed)
Patient has an appointment today at 1:30 with Dr. Quincy Simmonds for a 2 week reck. Patient has started her cycle and is unsure if she needs to reschedule. Please call ASAP.

## 2014-02-08 NOTE — Progress Notes (Signed)
Patient ID: Beth Huerta, female   DOB: December 27, 1985, 28 y.o.   MRN: [id number] GYNECOLOGY  VISIT   HPI: 28 y.o.   Married  Caucasian  female   G0P0000 with Patient's last menstrual period was 02/05/2014.   here for   2 week post op. Status post laparoscopic left ovarian cystectomy and partial ovarian resection, fulguration of endometriosis on 01/24/14. Menses heavy this time, pad change every 2 hours.  Cramping was significant.  Will put in a new ring tonight.   No incision problems.   GYNECOLOGIC HISTORY: Patient's last menstrual period was 02/05/2014. Contraception:   Nuvaring Menopausal hormone therapy: n/a        OB History   Grav Para Term Preterm Abortions TAB SAB Ect Mult Living   0 0 0 0 0 0 0 0 0 0          Patient Active Problem List   Diagnosis Date Noted  . Left ovarian cyst 09/16/2013    Past Medical History  Diagnosis Date  . Kidney stones     rt with lithotripsy  . Depression     and anxiety  . 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 Colonic dysmotility     small bowel  . Anxiety     Past Surgical History  Procedure Laterality Date  . Tonsillectomy    . Colposcopy      CIN II  . Laparoscopic ovarian cystectomy Left 01/24/2014    Procedure: LAPAROSCOPIC OVARIAN LEFT CYSTECTOMY; BIOPSY UTERINE SEROSA;  Surgeon: Jamey Reas de Berton Lan, MD;  Location: [facility];  Service: Gynecology;  Laterality: Left;  . Lysis of adhesion N/A 01/24/2014    Procedure: LYSIS OF ADHESION, fulgeration of endometriosis ;  Surgeon: Jamey Reas de Berton Lan, MD;  Location: [facility];  Service: Gynecology;  Laterality: N/A;    Current Outpatient Prescriptions  Medication Sig Dispense Refill  . etonogestrel-ethinyl estradiol (NUVARING) 0.12-0.015 MG/24HR vaginal ring Insert vaginally and leave in place for 3 consecutive weeks, then remove for 1 week.  3 each  3  . IBUPROFEN PO Take 200 mg by mouth every 4 (four) hours as needed (menstral pain).        Marland Kitchen escitalopram (LEXAPRO) 10 MG tablet Take 1 tablet (10 mg total) by mouth daily.  30 tablet  8   No current facility-administered medications for this visit.     ALLERGIES: Review of patient's allergies indicates no known allergies.  Family History  Problem Relation Age of Onset  . Ovarian cancer Maternal Grandmother   . Heart disease Paternal Grandfather   . Lung cancer Father   . Ovarian cancer Maternal Aunt   . Colon cancer Maternal Grandmother     History   Social History  . Marital Status: Married    Spouse Name: N/A    Number of Children: 0  . Years of Education: N/A   Occupational History  . photographer/ recruiter    Social History Main Topics  . Smoking status: Never Smoker   . Smokeless tobacco: Never Used  . Alcohol Use: 1.0 oz/week    2 drink(s) per week     Comment: 1-2 drinks a week (alcohol, wine or beer)  . Drug Use: No  . Sexual Activity: Yes    Partners: Male     Comment: Nuvaring   Other Topics Concern  . Not on file   Social History Narrative  . No narrative on file  ROS:  Pertinent items are noted in HPI.  PHYSICAL EXAMINATION:    BP 110/60  Pulse 84  Ht 5' 4.5" (1.638 m)  Wt 157 lb (71.215 kg)  BMI 26.54 kg/m2  LMP 02/05/2014     General appearance: alert, cooperative and appears stated age Abdomen: incisions intact and without erythema, soft, non-tender; no masses,  no organomegaly No abnormal inguinal nodes palpated  Pelvic: External genitalia:  no lesions              Urethra:  normal appearing urethra with no masses, tenderness or lesions              Bartholins and Skenes: normal                 Vagina: normal appearing vagina with normal color and discharge, no lesions              Cervix: normal appearance                   Bimanual Exam:  Uterus:  uterus is normal size, shape, consistency and nontender                                      Adnexa: normal adnexa in size, nontender and no masses  Pathology - benign  endometriosis of the left ovary, biopsy of the uterine serosa - benign endometriosis.  ASSESSMENT  Doing well post op. Dysmenorrhea with first post op menses.  PLAN  Will use NuvaRing continuously, each ring for 4 weeks. If has breakthrough bleeding, may need to use each ring for 3 weeks at a time.  Follow up in 3 months for annual exam. Return sooner if needed.    An After Visit Summary was printed and given to the patient.  ______ minutes face to face time of which over 50% was spent in counseling.

## 2014-02-08 NOTE — Telephone Encounter (Signed)
Spoke with Dr. Quincy Simmonds, okay for office visit today while on cycle.   Spoke with patient. Advised that Dr. Quincy Simmonds okay with visit while on cycle. She is wondering how long she has to wear pads and when she can start tampons again. Advised to wear pad today and can discuss with Dr. Quincy Simmonds at appointment. Patient agreeable. Will keep office visit for today as scheduled.   Routing to provider for final review. Patient agreeable to disposition. Will close encounter

## 2014-04-03 ENCOUNTER — Telehealth: Payer: Self-pay | Admitting: Obstetrics and Gynecology

## 2014-04-03 NOTE — Telephone Encounter (Signed)
Patient calling with questions for nurse about her cycle.

## 2014-04-03 NOTE — Telephone Encounter (Signed)
Spoke with patient. Patient states that she is currently on Nuvaring and is using it continuously for endometriosis. Within the last week patient states "My vagina feels like it is on fire. It is raw and itching. I have been spotting for 3 days and it is almost black in color. I had to use a tampon yesterday because it was so much. When I took the tampon out it was black and mucousy."  Patient states she is currently wearing a pad. Denies abdominal pain, fever, and discharge. "I just want to know if this is normal." Advised would like patient to come in for office visit with Dr.Silva for evaluation. Patient agreeable. Appointment scheduled for 5/7 at 1300 with Dr.Silva. Advised if any change in symptoms ie increased bleeding, fever, abdominal pain to give our office a call back before appointment to be seen sooner or seek urgent care. Patient agreeable and verbalizes understanding.  Routing to provider for final review. Patient agreeable to disposition. Will close encounter

## 2014-04-06 ENCOUNTER — Encounter: Payer: Self-pay | Admitting: Obstetrics and Gynecology

## 2014-04-06 ENCOUNTER — Ambulatory Visit (INDEPENDENT_AMBULATORY_CARE_PROVIDER_SITE_OTHER): Payer: BC Managed Care – PPO | Admitting: Obstetrics and Gynecology

## 2014-04-06 VITALS — BP 120/66 | HR 70 | Ht 64.5 in | Wt 159.6 lb

## 2014-04-06 DIAGNOSIS — N760 Acute vaginitis: Secondary | ICD-10-CM

## 2014-04-06 DIAGNOSIS — N926 Irregular menstruation, unspecified: Secondary | ICD-10-CM

## 2014-04-06 LAB — POCT URINE PREGNANCY: Preg Test, Ur: NEGATIVE

## 2014-04-06 MED ORDER — FLUCONAZOLE 150 MG PO TABS: 150.0000 mg | ORAL_TABLET | Freq: Once | ORAL | Status: DC

## 2014-04-06 MED ORDER — NYSTATIN-TRIAMCINOLONE 100000-0.1 UNIT/GM-% EX CREA: 1.0000 "application " | TOPICAL_CREAM | Freq: Two times a day (BID) | CUTANEOUS | Status: DC

## 2014-04-06 NOTE — Progress Notes (Signed)
Patient ID: Beth Huerta, female   DOB: 1986-06-12, 27 y.o.   MRN: [id number] GYNECOLOGY VISIT  PCP:   None  Referring provider:   HPI: 28 y.o.   Married  Caucasian  female   G0P0000 with Patient's last menstrual period was 04/03/2014.   here for  Vaginal irritation/itching and breakthrough bleeding while using Nuvaring continuously.   Mostly external irritation. No new exposures.  Burns externally when voids.   Having bleeding for 3 - 4 days.  Using the NuvaRing for 2 months, leaving each ring in for four weeks.  Some discomfort with the bleeding.   Some weight gain.  Increase in size of breasts since starting NuvaRing.     Urine UPT: Neg  GYNECOLOGIC HISTORY: Patient's last menstrual period was 04/03/2014. 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  Had colposcopy which revealed CIN II and paps normal since.   OB History   Grav Para Term Preterm Abortions TAB SAB Ect Mult Living   0 0 0 0 0 0 0 0 0 0        LIFESTYLE: Exercise:    Running and walking    Tobacco: no Alcohol:   occ. Drug use:  no  Patient Active Problem List   Diagnosis Date Noted  . Left ovarian cyst 09/16/2013    Past Medical History  Diagnosis Date  . Kidney stones     rt with lithotripsy  . Depression     and anxiety  . 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 Colonic dysmotility     small bowel  . Anxiety     Past Surgical History  Procedure Laterality Date  . Tonsillectomy    . Colposcopy      CIN II  . Laparoscopic ovarian cystectomy Left 01/24/2014    Procedure: LAPAROSCOPIC OVARIAN LEFT CYSTECTOMY; BIOPSY UTERINE SEROSA;  Surgeon: Jamey Reas de Berton Lan, MD;  Location: [facility];  Service: Gynecology;  Laterality: Left;  . Lysis of adhesion N/A 01/24/2014    Procedure: LYSIS OF ADHESION, fulgeration of endometriosis ;  Surgeon: Jamey Reas de Berton Lan, MD;  Location: [facility];  Service: Gynecology;  Laterality: N/A;    Current Outpatient Prescriptions  Medication Sig Dispense Refill  . escitalopram (LEXAPRO) 10 MG tablet Take 1 tablet (10 mg total) by mouth daily.  30 tablet  8  . etonogestrel-ethinyl estradiol (NUVARING) 0.12-0.015 MG/24HR vaginal ring Insert vaginally and leave in place for 4 consecutive weeks, then place a new ring also for 4 weeks.  You will be using the ring continuously.  3 each  1  . IBUPROFEN PO Take 200 mg by mouth every 4 (four) hours as needed (menstral pain).        No current facility-administered medications for this visit.     ALLERGIES: Review of patient's allergies indicates no known allergies.  Family History  Problem Relation Age of Onset  . Ovarian cancer Maternal Grandmother   . Heart disease Paternal Grandfather   . Lung cancer Father   .  Ovarian cancer Maternal Aunt   . Colon cancer Maternal Grandmother     History   Social History  . Marital Status: Married    Spouse Name: N/A    Number of Children: 0  . Years of Education: N/A   Occupational History  . photographer/ recruiter    Social History Main Topics  . Smoking status: Never Smoker   . Smokeless tobacco: Never Used  . Alcohol Use: 1.0 oz/week    2 drink(s) per week     Comment: 1-2 drinks a week (alcohol, wine or beer)  . Drug Use: No  . Sexual Activity: Yes    Partners: Male     Comment: Nuvaring   Other Topics Concern  . Not on file   Social History Narrative  . No narrative on file    ROS:  Pertinent items are noted in HPI.  PHYSICAL EXAMINATION:    BP 120/66  Pulse 70  Ht 5' 4.5" (1.638 m)  Wt 159 lb 9.6 oz (72.394 kg)  BMI 26.98 kg/m2  LMP 04/03/2014   Wt Readings  from Last 3 Encounters:  04/06/14 159 lb 9.6 oz (72.394 kg)  02/08/14 157 lb (71.215 kg)  01/24/14 152 lb (68.947 kg)     Ht Readings from Last 3 Encounters:  04/06/14 5' 4.5" (1.638 m)  02/08/14 5' 4.5" (1.638 m)  01/24/14 5\' 5"  (1.651 m)    General appearance: alert, cooperative and appears stated age Head: Normocephalic, without obvious abnormality, atraumatic Neck: no adenopathy, supple, symmetrical, trachea midline and thyroid not enlarged, symmetric, no tenderness/mass/nodules Lungs: clear to auscultation bilaterally Breasts: Inspection negative, No nipple retraction or dimpling, No nipple discharge or bleeding, No axillary or supraclavicular adenopathy, Normal to palpation without dominant masses Heart: regular rate and rhythm Abdomen: soft, non-tender; no masses,  no organomegaly Extremities: extremities normal, atraumatic, no cyanosis or edema Skin: Skin color, texture, turgor normal. No rashes or lesions Lymph nodes: Cervical, supraclavicular, and axillary nodes normal. No abnormal inguinal nodes palpated Neurologic: Grossly normal  Pelvic: External genitalia:  Erythema of labia and excoriations of the labia majora, partially shaved.               Urethra:  normal appearing urethra with no masses, tenderness or lesions              Bartholins and Skenes: normal                 Vagina: normal appearing vagina with normal color and discharge, no lesions, menstrual flow.              Cervix: normal appearance.                   Bimanual Exam:  Uterus:  uterus is normal size, shape, consistency and nontender.  Ring in vagina.                                       Adnexa: normal adnexa in size, nontender and no masses                                        ASSESSMENT  Break through bleeding on Nuvaring.  Using ring continuously.  Yeast vulvovaginitis.  PLAN  Diflucan Rx.  See EPIC. Mycolog II cream.  See EPIC. Discussed  dietary ways to reduce yeast infections.   Discussed avoiding tight clothing and wet work out clothes or bathing suits. Removed ring next week and have a withdrawal bleed for 3 -4 days.  Annual exam in June 2015.    An After Visit Summary was printed and given to the patient.  15 minutes face to face time of which over 505 was spent in counseling.

## 2014-04-06 NOTE — Patient Instructions (Signed)
Monilial Vaginitis  Vaginitis in a soreness, swelling and redness (inflammation) of the vagina and vulva. Monilial vaginitis is not a sexually transmitted infection.  CAUSES   Yeast vaginitis is caused by yeast (candida) that is normally found in your vagina. With a yeast infection, the candida has overgrown in number to a point that upsets the chemical balance.  SYMPTOMS   · White, thick vaginal discharge.  · Swelling, itching, redness and irritation of the vagina and possibly the lips of the vagina (vulva).  · Burning or painful urination.  · Painful intercourse.  DIAGNOSIS   Things that may contribute to monilial vaginitis are:  · Postmenopausal and virginal states.  · Pregnancy.  · Infections.  · Being tired, sick or stressed, especially if you had monilial vaginitis in the past.  · Diabetes. Good control will help lower the chance.  · Birth control pills.  · Tight fitting garments.  · Using bubble bath, feminine sprays, douches or deodorant tampons.  · Taking certain medications that kill germs (antibiotics).  · Sporadic recurrence can occur if you become ill.  TREATMENT   Your caregiver will give you medication.  · There are several kinds of anti monilial vaginal creams and suppositories specific for monilial vaginitis. For recurrent yeast infections, use a suppository or cream in the vagina 2 times a week, or as directed.  · Anti-monilial or steroid cream for the itching or irritation of the vulva may also be used. Get your caregiver's permission.  · Painting the vagina with methylene blue solution may help if the monilial cream does not work.  · Eating yogurt may help prevent monilial vaginitis.  HOME CARE INSTRUCTIONS   · Finish all medication as prescribed.  · Do not have sex until treatment is completed or after your caregiver tells you it is okay.  · Take warm sitz baths.  · Do not douche.  · Do not use tampons, especially scented ones.  · Wear cotton underwear.  · Avoid tight pants and panty  hose.  · Tell your sexual partner that you have a yeast infection. They should go to their caregiver if they have symptoms such as mild rash or itching.  · Your sexual partner should be treated as well if your infection is difficult to eliminate.  · Practice safer sex. Use condoms.  · Some vaginal medications cause latex condoms to fail. Vaginal medications that harm condoms are:  · Cleocin cream.  · Butoconazole (Femstat®).  · Terconazole (Terazol®) vaginal suppository.  · Miconazole (Monistat®) (may be purchased over the counter).  SEEK MEDICAL CARE IF:   · You have a temperature by mouth above 102° F (38.9° C).  · The infection is getting worse after 2 days of treatment.  · The infection is not getting better after 3 days of treatment.  · You develop blisters in or around your vagina.  · You develop vaginal bleeding, and it is not your menstrual period.  · You have pain when you urinate.  · You develop intestinal problems.  · You have pain with sexual intercourse.  Document Released: 08/27/2005 Document Revised: 02/09/2012 Document Reviewed: 05/11/2009  ExitCare® Patient Information ©2014 [employer].

## 2014-05-11 ENCOUNTER — Encounter: Payer: Self-pay | Admitting: Obstetrics and Gynecology

## 2014-05-11 ENCOUNTER — Ambulatory Visit (INDEPENDENT_AMBULATORY_CARE_PROVIDER_SITE_OTHER): Payer: BC Managed Care – PPO | Admitting: Obstetrics and Gynecology

## 2014-05-11 VITALS — BP 122/70 | HR 70 | Ht 64.75 in | Wt 155.6 lb

## 2014-05-11 DIAGNOSIS — Z0000 Encounter for general adult medical examination without abnormal findings: Secondary | ICD-10-CM

## 2014-05-11 DIAGNOSIS — Z01419 Encounter for gynecological examination (general) (routine) without abnormal findings: Secondary | ICD-10-CM

## 2014-05-11 DIAGNOSIS — Z309 Encounter for contraceptive management, unspecified: Secondary | ICD-10-CM

## 2014-05-11 LAB — POCT URINALYSIS DIPSTICK
BILIRUBIN UA: NEGATIVE
GLUCOSE UA: NEGATIVE
KETONES UA: NEGATIVE
Leukocytes, UA: NEGATIVE
Nitrite, UA: NEGATIVE
PH UA: 5
Protein, UA: NEGATIVE
RBC UA: NEGATIVE
Urobilinogen, UA: NEGATIVE

## 2014-05-11 MED ORDER — ETONOGESTREL-ETHINYL ESTRADIOL 0.12-0.015 MG/24HR VA RING: VAGINAL_RING | VAGINAL | Status: DC

## 2014-05-11 NOTE — Patient Instructions (Signed)
Levonorgestrel intrauterine device (IUD) What is this medicine? LEVONORGESTREL IUD (LEE voe nor jes trel) is a contraceptive (birth control) device. The device is placed inside the uterus by a healthcare professional. It is used to prevent pregnancy and can also be used to treat heavy bleeding that occurs during your period. Depending on the device, it can be used for 3 to 5 years. This medicine may be used for other purposes; ask your health care provider or pharmacist if you have questions. COMMON BRAND NAME(S): Mirena, Skyla What should I tell my health care provider before I take this medicine? They need to know if you have any of these conditions: -abnormal Pap smear -cancer of the breast, uterus, or cervix -diabetes -endometritis -genital or pelvic infection now or in the past -have more than one sexual partner or your partner has more than one partner -heart disease -history of an ectopic or tubal pregnancy -immune system problems -IUD in place -liver disease or tumor -problems with blood clots or take blood-thinners -use intravenous drugs -uterus of unusual shape -vaginal bleeding that has not been explained -an unusual or allergic reaction to levonorgestrel, other hormones, silicone, or polyethylene, medicines, foods, dyes, or preservatives -pregnant or trying to get pregnant -breast-feeding How should I use this medicine? This device is placed inside the uterus by a health care professional. Talk to your pediatrician regarding the use of this medicine in children. Special care may be needed. Overdosage: If you think you have taken too much of this medicine contact a poison control center or emergency room at once. NOTE: This medicine is only for you. Do not share this medicine with others. What if I miss a dose? This does not apply. What may interact with this medicine? Do not take this medicine with any of the following  medications: -amprenavir -bosentan -fosamprenavir This medicine may also interact with the following medications: -aprepitant -barbiturate medicines for inducing sleep or treating seizures -bexarotene -griseofulvin -medicines to treat seizures like carbamazepine, ethotoin, felbamate, oxcarbazepine, phenytoin, topiramate -modafinil -pioglitazone -rifabutin -rifampin -rifapentine -some medicines to treat HIV infection like atazanavir, indinavir, lopinavir, nelfinavir, tipranavir, ritonavir -St. John's wort -warfarin This list may not describe all possible interactions. Give your health care provider a list of all the medicines, herbs, non-prescription drugs, or dietary supplements you use. Also tell them if you smoke, drink alcohol, or use illegal drugs. Some items may interact with your medicine. What should I watch for while using this medicine? Visit your doctor or health care professional for regular check ups. See your doctor if you or your partner has sexual contact with others, becomes HIV positive, or gets a sexual transmitted disease. This product does not protect you against HIV infection (AIDS) or other sexually transmitted diseases. You can check the placement of the IUD yourself by reaching up to the top of your vagina with clean fingers to feel the threads. Do not pull on the threads. It is a good habit to check placement after each menstrual period. Call your doctor right away if you feel more of the IUD than just the threads or if you cannot feel the threads at all. The IUD may come out by itself. You may become pregnant if the device comes out. If you notice that the IUD has come out use a backup birth control method like condoms and call your health care provider. Using tampons will not change the position of the IUD and are okay to use during your period. What side effects may I   notice from receiving this medicine? Side effects that you should report to your doctor or  health care professional as soon as possible: -allergic reactions like skin rash, itching or hives, swelling of the face, lips, or tongue -fever, flu-like symptoms -genital sores -high blood pressure -no menstrual period for 6 weeks during use -pain, swelling, warmth in the leg -pelvic pain or tenderness -severe or sudden headache -signs of pregnancy -stomach cramping -sudden shortness of breath -trouble with balance, talking, or walking -unusual vaginal bleeding, discharge -yellowing of the eyes or skin Side effects that usually do not require medical attention (report to your doctor or health care professional if they continue or are bothersome): -acne -breast pain -change in sex drive or performance -changes in weight -cramping, dizziness, or faintness while the device is being inserted -headache -irregular menstrual bleeding within first 3 to 6 months of use -nausea This list may not describe all possible side effects. Call your doctor for medical advice about side effects. You may report side effects to FDA at 1-800-FDA-1088. Where should I keep my medicine? This does not apply. NOTE: This sheet is a summary. It may not cover all possible information. If you have questions about this medicine, talk to your doctor, pharmacist, or health care provider.  2014, Elsevier/Gold Standard. (2011-12-18 13:54:04)  EXERCISE AND DIET:  We recommended that you start or continue a regular exercise program for good health. Regular exercise means any activity that makes your heart beat faster and makes you sweat.  We recommend exercising at least 30 minutes per day at least 3 days a week, preferably 4 or 5.  We also recommend a diet low in fat and sugar.  Inactivity, poor dietary choices and obesity can cause diabetes, heart attack, stroke, and kidney damage, among others.    ALCOHOL AND SMOKING:  Women should limit their alcohol intake to no more than 7 drinks/beers/glasses of wine (combined,  not each!) per week. Moderation of alcohol intake to this level decreases your risk of breast cancer and liver damage. And of course, no recreational drugs are part of a healthy lifestyle.  And absolutely no smoking or even second hand smoke. Most people know smoking can cause heart and lung diseases, but did you know it also contributes to weakening of your bones? Aging of your skin?  Yellowing of your teeth and nails?  CALCIUM AND VITAMIN D:  Adequate intake of calcium and Vitamin D are recommended.  The recommendations for exact amounts of these supplements seem to change often, but generally speaking 600 mg of calcium (either carbonate or citrate) and 800 units of Vitamin D per day seems prudent. Certain women may benefit from higher intake of Vitamin D.  If you are among these women, your doctor will have told you during your visit.    PAP SMEARS:  Pap smears, to check for cervical cancer or precancers,  have traditionally been done yearly, although recent scientific advances have shown that most women can have pap smears less often.  However, every woman still should have a physical exam from her gynecologist every year. It will include a breast check, inspection of the vulva and vagina to check for abnormal growths or skin changes, a visual exam of the cervix, and then an exam to evaluate the size and shape of the uterus and ovaries.  And after 30 years of age, a rectal exam is indicated to check for rectal cancers. We will also provide age appropriate advice regarding health maintenance, like when  you should have certain vaccines, screening for sexually transmitted diseases, bone density testing, colonoscopy, mammograms, etc.   MAMMOGRAMS:  All women over 26 years old should have a yearly mammogram. Many facilities now offer a "3D" mammogram, which may cost around $50 extra out of pocket. If possible,  we recommend you accept the option to have the 3D mammogram performed.  It both reduces the number of  women who will be called back for extra views which then turn out to be normal, and it is better than the routine mammogram at detecting truly abnormal areas.    COLONOSCOPY:  Colonoscopy to screen for colon cancer is recommended for all women at age 63.  We know, you hate the idea of the prep.  We agree, BUT, having colon cancer and not knowing it is worse!!  Colon cancer so often starts as a polyp that can be seen and removed at colonscopy, which can quite literally save your life!  And if your first colonoscopy is normal and you have no family history of colon cancer, most women don't have to have it again for 10 years.  Once every ten years, you can do something that may end up saving your life, right?  We will be happy to help you get it scheduled when you are ready.  Be sure to check your insurance coverage so you understand how much it will cost.  It may be covered as a preventative service at no cost, but you should check your particular policy.

## 2014-05-11 NOTE — Progress Notes (Signed)
Patient ID: Beth Huerta, female   DOB: 1986-10-10, 28 y.o.   MRN: [id number] GYNECOLOGY VISIT  PCP:   None  Referring provider:   HPI: 28 y.o.   Married  Caucasian  female   G0P0000 with Patient's last menstrual period was 04/20/2014.   here for   AEX. Started NuvaRing about 3 weeks ago.  Had been on NuvaRing for about one year prior to surgery. Having weight gain.   Decreased libido for one year.  On Lexapro for about 10 years.  Restarted about 10 months ago.  This really reduces anxiety for the patient.  Wants to continue this.  Helps mood swings from one extreme to another.   Hgb:    12.8 Urine:  Neg  GYNECOLOGIC HISTORY: Patient's last menstrual period was 04/20/2014. Sexually active:  yes Partner preference: female Contraception: Nuvaring   Menopausal hormone therapy: n/a DES exposure:  no  Blood transfusions:  no Sexually transmitted diseases:   no GYN procedures and prior surgeries:  Laparoscopic Lysis of adhesions and left ovarian cystectomy 01/2014  Last mammogram: n/a                Last pap and high risk HPV testing: 10-29-11 wnl, 2014 in [place] - ? Had HPV.  History of abnormal pap smear:  Hx of colposcopy revealing CIN II in 2011.   OB History   Grav Para Term Preterm Abortions TAB SAB Ect Mult Living   0 0 0 0 0 0 0 0 0 0        LIFESTYLE: Exercise:   Running, walking and zumba            Tobacco:   no Alcohol:    1-2 drinks per week Drug use: no   OTHER HEALTH MAINTENANCE: Tetanus/TDap:   Years ago Gardisil:               no Influenza:            08/2013 Zostavax:            n/a  Bone density:       n/a Colonoscopy:       08/2013 with Dr. Delfin Edis due to abdominal pain--normal.  Cholesterol check:   never  Family History  Problem Relation Age of Onset  . Ovarian cancer Maternal Grandmother   . Colon cancer Maternal Grandmother   . Heart disease Paternal Grandfather   . Lung cancer Father   . Ovarian cancer Maternal Aunt      There are no active problems to display for this patient.  Past Medical History  Diagnosis Date  . Kidney stones     rt with lithotripsy  . Depression     and anxiety  . 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 Colonic dysmotility     small bowel  . Anxiety     Past Surgical History  Procedure Laterality Date  . Tonsillectomy    . Colposcopy      CIN II  . Laparoscopic ovarian cystectomy Left 01/24/2014    Procedure: LAPAROSCOPIC OVARIAN LEFT CYSTECTOMY; BIOPSY UTERINE SEROSA;  Surgeon: Jamey Reas de Berton Lan, MD;  Location: [facility];  Service: Gynecology;  Laterality: Left;  . Lysis of adhesion N/A 01/24/2014    Procedure: LYSIS OF ADHESION, fulgeration of endometriosis ;  Surgeon: Jamey Reas de Berton Lan, MD;  Location: [facility];  Service: Gynecology;  Laterality: N/A;    ALLERGIES: Review of  patient's allergies indicates no known allergies.  Current Outpatient Prescriptions  Medication Sig Dispense Refill  . escitalopram (LEXAPRO) 10 MG tablet Take 1 tablet (10 mg total) by mouth daily.  30 tablet  8  . etonogestrel-ethinyl estradiol (NUVARING) 0.12-0.015 MG/24HR vaginal ring Insert vaginally and leave in place for 4 consecutive weeks, then place a new ring also for 4 weeks.  You will be using the ring continuously.  3 each  1  . IBUPROFEN PO Take 200 mg by mouth every 4 (four) hours as needed (menstral pain).        No current facility-administered medications for this visit.     ROS:  Pertinent items are noted in HPI.  SOCIAL HISTORY:  Married.  Going on a cruise.   PHYSICAL EXAMINATION:    BP 122/70  Pulse 70  Ht 5' 4.75" (1.645 m)  Wt 155 lb 9.6 oz (70.58 kg)  BMI 26.08 kg/m2  LMP 04/20/2014   Wt Readings from Last 3 Encounters:  05/11/14 155 lb 9.6 oz (70.58 kg)  04/06/14 159 lb 9.6 oz (72.394 kg)  02/08/14 157 lb (71.215 kg)     Ht Readings from Last 3 Encounters:  05/11/14 5' 4.75" (1.645 m)  04/06/14 5' 4.5" (1.638 m)   02/08/14 5' 4.5" (1.638 m)    General appearance: alert, cooperative and appears stated age Head: Normocephalic, without obvious abnormality, atraumatic Neck: no adenopathy, supple, symmetrical, trachea midline and thyroid not enlarged, symmetric, no tenderness/mass/nodules Lungs: clear to auscultation bilaterally Breasts: Inspection negative, No nipple retraction or dimpling, No nipple discharge or bleeding, No axillary or supraclavicular adenopathy, Normal to palpation without dominant masses Heart: regular rate and rhythm Abdomen: soft, non-tender; no masses,  no organomegaly Extremities: extremities normal, atraumatic, no cyanosis or edema Skin: Skin color, texture, turgor normal. No rashes or lesions Lymph nodes: Cervical, supraclavicular, and axillary nodes normal. No abnormal inguinal nodes palpated Neurologic: Grossly normal  Pelvic: External genitalia:  no lesions              Urethra:  normal appearing urethra with no masses, tenderness or lesions              Bartholins and Skenes: normal                 Vagina: normal appearing vagina with normal color and discharge, no lesions              Cervix: normal appearance              Pap and high risk HPV testing done: yes.            Bimanual Exam:  Uterus:  uterus is normal size, shape, consistency and nontender                                      Adnexa: normal adnexa in size, nontender and no masses                                      Rectovaginal: Confirms                                      Anus:  normal sphincter tone, no lesions  ASSESSMENT  Normal gynecologic exam.  Status post laparoscopic left ovarian cystectomy for endometrioma. History of CIN II.  Decreased libido and weight gain on NuvaRing.   PLAN  Pap smear and high risk HPV testing Counseled on self breast exam, Calcium and vitamin D intake, exercise. Will precert Mirena IUD. Discussed risks and benefits.  Refill NuvaRing 2 months with 6 refills  until has the IUD placed.  Will plan for Cytotec, paracervical block, and motrin for IUD insertion.  Return annually or prn   An After Visit Summary was printed and given to the patient.

## 2014-05-12 LAB — HEMOGLOBIN, FINGERSTICK: Hemoglobin, fingerstick: 12.8 g/dL (ref 12.0–16.0)

## 2014-05-12 LAB — IPS PAP TEST WITH HPV

## 2014-08-09 ENCOUNTER — Telehealth: Payer: Self-pay | Admitting: Obstetrics and Gynecology

## 2014-08-09 MED ORDER — MISOPROSTOL 200 MCG PO TABS: ORAL_TABLET | ORAL | Status: DC

## 2014-08-09 NOTE — Telephone Encounter (Signed)
First rx for Cytotec 267mcg printed. Reordered Cytotec 264mcg #2 0RF to pharmacy of choice electronically.

## 2014-08-09 NOTE — Telephone Encounter (Signed)
Patient is ready to schedule her IUD insertion appointment.  °

## 2014-08-09 NOTE — Addendum Note (Signed)
Addended by: Rolla Etienne E on: 08/09/2014 03:15 PM   Modules accepted: Orders, Medications

## 2014-08-09 NOTE — Telephone Encounter (Signed)
Spoke with patient. Patient states that she started her cycle last night. Patient would like to schedule IUD insertion at this time. Appointment scheduled for Monday 9/14 at 3:15pm with Dr.Silva. Patient is agreeable to date and time. Pre procedure instructions given.  Motrin instructions given. Motrin=Advil=Ibuprofen, 800 mg one hour before appointment. Eat a meal and hydrate well before appointment. Cytotec instructions given. Take one tablet the night before procedure and one tablet the morning of procedure. Cytotec sent to pharmacy of choice.Since patient is scheduled within the 72 hour time frame for procedure cancellation advised to call ASAP if needs to cancel. Patient agreeable.  Routing to provider for final review. Patient agreeable to disposition. Will close encounter

## 2014-08-14 ENCOUNTER — Encounter: Payer: Self-pay | Admitting: Obstetrics and Gynecology

## 2014-08-14 ENCOUNTER — Telehealth: Payer: Self-pay | Admitting: Obstetrics and Gynecology

## 2014-08-14 ENCOUNTER — Ambulatory Visit (INDEPENDENT_AMBULATORY_CARE_PROVIDER_SITE_OTHER): Payer: BC Managed Care – PPO | Admitting: Obstetrics and Gynecology

## 2014-08-14 VITALS — BP 110/68 | HR 76 | Ht 64.75 in | Wt 159.4 lb

## 2014-08-14 DIAGNOSIS — Z30430 Encounter for insertion of intrauterine contraceptive device: Secondary | ICD-10-CM

## 2014-08-14 NOTE — Telephone Encounter (Signed)
Patient took 800mg  of ibuprofen and was told she could take another medication as well. Patient does not remember what Dr.Silva instructed.

## 2014-08-14 NOTE — Progress Notes (Signed)
Patient ID: Beth Huerta, female   DOB: 1986-09-13, 28 y.o.   MRN: [id number] GYNECOLOGY  VISIT   HPI: 28 y.o.   Married  Caucasian  female   G0P0000 with Patient's last menstrual period was 08/09/2014.   here for  Mirena IUD insertion.   Pain is much better after doing laparoscopy for endometriosis.   Today is day 6 of her cycle.  Took Cytotec last hs and this am and buprofen today.   GYNECOLOGIC HISTORY: Patient's last menstrual period was 08/09/2014. Contraception: Nuvaring   Menopausal hormone therapy: n/a        OB History   Grav Para Term Preterm Abortions TAB SAB Ect Mult Living   0 0 0 0 0 0 0 0 0 0          There are no active problems to display for this patient.   Past Medical History  Diagnosis Date  . Kidney stones     rt with lithotripsy  . Depression     and anxiety  . 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 Colonic dysmotility     small bowel  . Anxiety     Past Surgical History  Procedure Laterality Date  . Tonsillectomy    . Colposcopy      CIN II  . Laparoscopic ovarian cystectomy Left 01/24/2014    Procedure: LAPAROSCOPIC OVARIAN LEFT CYSTECTOMY; BIOPSY UTERINE SEROSA;  Surgeon: Jamey Reas de Berton Lan, MD;  Location: [facility];  Service: Gynecology;  Laterality: Left;  . Lysis of adhesion N/A 01/24/2014    Procedure: LYSIS OF ADHESION, fulgeration of endometriosis ;  Surgeon: Jamey Reas de Berton Lan, MD;  Location: [facility];  Service: Gynecology;  Laterality: N/A;    Current Outpatient Prescriptions  Medication Sig Dispense Refill  . escitalopram (LEXAPRO) 10 MG tablet Take 1 tablet (10 mg total) by mouth daily.  30 tablet  8  . etonogestrel-ethinyl estradiol (NUVARING) 0.12-0.015 MG/24HR vaginal ring Insert vaginally and leave in place for 4 consecutive weeks, then place a new ring also for 4 weeks.  You will be using the ring continuously.  2 each  6  . IBUPROFEN PO Take 200 mg by mouth every 4 (four) hours as  needed (menstral pain).       . misoprostol (CYTOTEC) 200 MCG tablet Take Cytotec 200 mcg tablet. 1 tablet night before the procedure. 1 tablet the morning of the procedure.  2 tablet  0   No current facility-administered medications for this visit.     ALLERGIES: Review of patient's allergies indicates no known allergies.  Family History  Problem Relation Age of Onset  . Ovarian cancer Maternal Grandmother   . Colon cancer Maternal Grandmother   . Heart disease Paternal Grandfather   . Lung cancer Father   . Ovarian cancer Maternal Aunt     History   Social History  . Marital Status: Married    Spouse Name: N/A    Number of Children: 0  . Years of Education: N/A   Occupational History  . photographer/ recruiter    Social History Main Topics  . Smoking status: Never Smoker   . Smokeless tobacco: Never Used  . Alcohol Use: 1.0 oz/week    2 drink(s) per week     Comment: 1-2 drinks a week (alcohol, wine or beer)  . Drug Use: No  . Sexual Activity: Yes    Partners: Male     Comment:  Nuvaring   Other Topics Concern  . Not on file   Social History Narrative  . No narrative on file    ROS:  Pertinent items are noted in HPI.  PHYSICAL EXAMINATION:    BP 110/68  Pulse 76  Ht 5' 4.75" (1.645 m)  Wt 159 lb 6.4 oz (72.303 kg)  BMI 26.72 kg/m2  LMP 08/09/2014     General appearance: alert, cooperative and appears stated age Lungs: clear to auscultation bilaterally Heart: regular rate and rhythm Abdomen: soft, non-tender; no masses,  no organomegaly No abnormal inguinal nodes palpated  Pelvic: External genitalia:  no lesions              Urethra:  normal appearing urethra with no masses, tenderness or lesions              Bartholins and Skenes: normal                 Vagina: normal appearing vagina with normal color and discharge, no lesions              Cervix: normal appearance                   Bimanual Exam:  Uterus:  uterus is normal size, shape,  consistency and nontender                                      IUD insertion - Mirena - Lot number  [id number]                     , Expiration 07/2016  Consent for procedure.  Speculum place in vagina. Sterile prep of cervix with Hibiclens. Paracervical block with 10 cc 1% lidocaine - Lot number [id number],  Expiration 05/02/15 Uterus sounded to 7 cm.  Mirena IUD placed without difficulty.  Strings trimmed.  No complications.  Minimal EBL.  ASSESSMENT  Mirena IUD insertion.   PLAN  Precautions given.  Follow up in 5 weeks.    An After Visit Summary was printed and given to the patient.  __15____ minutes face to face time of which over 50% was spent in counseling.

## 2014-08-14 NOTE — Telephone Encounter (Signed)
Spoke with patient. Advised patient that she can alternate ibuprofen/motrin with tylenol per Dr.Silva. Patient agreeable and verbalizes understanding.  Routing to provider for final review. Patient agreeable to disposition. Will close encounter

## 2014-08-17 ENCOUNTER — Other Ambulatory Visit: Payer: Self-pay | Admitting: Obstetrics and Gynecology

## 2014-08-18 NOTE — Telephone Encounter (Signed)
Last AEX: 05/11/14 Last refill:07/21/13 #30 X 8 Current AEX:NS  Please advise

## 2014-09-15 ENCOUNTER — Ambulatory Visit: Payer: BC Managed Care – PPO | Admitting: Obstetrics and Gynecology

## 2014-09-15 ENCOUNTER — Encounter: Payer: Self-pay | Admitting: Obstetrics and Gynecology

## 2014-09-15 ENCOUNTER — Ambulatory Visit (INDEPENDENT_AMBULATORY_CARE_PROVIDER_SITE_OTHER): Payer: BC Managed Care – PPO | Admitting: Obstetrics and Gynecology

## 2014-09-15 VITALS — BP 110/60 | HR 80 | Resp 18 | Ht 64.75 in | Wt 158.0 lb

## 2014-09-15 DIAGNOSIS — Z30431 Encounter for routine checking of intrauterine contraceptive device: Secondary | ICD-10-CM

## 2014-09-15 NOTE — Progress Notes (Signed)
GYNECOLOGY  VISIT   HPI: 28 y.o.   Married  Caucasian  female   G0P0000 with No LMP recorded. Patient is not currently having periods (Reason: IUD).   here for  IUD Check  Mirena IUD insertion 08/14/14. Still having some bleeding.  Blood is black.  Cramps resolved.  Intercourse associated a little painful afterward Overall doing about the same as prior to the IUD when she was using NuvaRing.  History of endometriosis and laparoscopic left ovarian cystectomy and treatment of endometriosis 01/24/14.  GYNECOLOGIC HISTORY: No LMP recorded. Patient is not currently having periods (Reason: IUD). Contraception:   IUD Menopausal hormone therapy: None        OB History   Grav Para Term Preterm Abortions TAB SAB Ect Mult Living   0 0 0 0 0 0 0 0 0 0          There are no active problems to display for this patient.   Past Medical History  Diagnosis Date  . Kidney stones     rt with lithotripsy  . Depression     and anxiety  . 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 Colonic dysmotility     small bowel  . Anxiety     Past Surgical History  Procedure Laterality Date  . Tonsillectomy    . Colposcopy      CIN II  . Laparoscopic ovarian cystectomy Left 01/24/2014    Procedure: LAPAROSCOPIC OVARIAN LEFT CYSTECTOMY; BIOPSY UTERINE SEROSA;  Surgeon: Jamey Reas de Berton Lan, MD;  Location: [facility];  Service: Gynecology;  Laterality: Left;  . Lysis of adhesion N/A 01/24/2014    Procedure: LYSIS OF ADHESION, fulgeration of endometriosis ;  Surgeon: Jamey Reas de Berton Lan, MD;  Location: [facility];  Service: Gynecology;  Laterality: N/A;    Current Outpatient Prescriptions  Medication Sig Dispense Refill  . escitalopram (LEXAPRO) 10 MG tablet TAKE ONE TABLET BY MOUTH ONCE DAILY  30 tablet  8  . IBUPROFEN PO Take 200 mg by mouth every 4 (four) hours as needed (menstral pain).        No current facility-administered medications for this visit.      ALLERGIES: Review of patient's allergies indicates no known allergies.  Family History  Problem Relation Age of Onset  . Ovarian cancer Maternal Grandmother   . Colon cancer Maternal Grandmother   . Heart disease Paternal Grandfather   . Lung cancer Father   . Ovarian cancer Maternal Aunt     History   Social History  . Marital Status: Married    Spouse Name: N/A    Number of Children: 0  . Years of Education: N/A   Occupational History  . photographer/ recruiter    Social History Main Topics  . Smoking status: Never Smoker   . Smokeless tobacco: Never Used  . Alcohol Use: 1.0 oz/week    2 drink(s) per week     Comment: 1-2 drinks a week (alcohol, wine or beer)  . Drug Use: No  . Sexual Activity: Yes    Partners: Male    Birth Control/ Protection: IUD   Other Topics Concern  . Not on file   Social History Narrative  . No narrative on file    ROS:  Pertinent items are noted in HPI.  PHYSICAL EXAMINATION:    BP 110/60  Pulse 80  Resp 18  Ht 5' 4.75" (1.645 m)  Wt 158 lb (71.668 kg)  BMI 26.48 kg/m2     General appearance: alert, cooperative and appears stated age  Pelvic: External genitalia:  no lesions              Urethra:  normal appearing urethra with no masses, tenderness or lesions              Bartholins and Skenes: normal                 Vagina: normal appearing vagina with normal color and discharge, no lesions              Cervix: normal appearance.   IUD strings noted.  Minimal dark blood and mucous noted.                   Bimanual Exam:  Uterus:  uterus is normal size, shape, consistency and nontender                                      Adnexa: normal adnexa in size, nontender and no masses                                      ASSESSMENT  Mirena IUD surveillance.  Doing well overall.  History of endometriosis.  PLAN  Reassurance regarding bleeding and re-education regarding bleeding profile during first several months of Mirena IUD.   Return for routine annual exam next year and prn.   An After Visit Summary was printed and given to the patient.  __15____ minutes face to face time of which over 50% was spent in counseling.

## 2014-09-16 ENCOUNTER — Encounter: Payer: Self-pay | Admitting: Obstetrics and Gynecology

## 2015-03-06 ENCOUNTER — Telehealth: Payer: Self-pay | Admitting: Obstetrics and Gynecology

## 2015-03-06 DIAGNOSIS — R102 Pelvic and perineal pain: Secondary | ICD-10-CM

## 2015-03-06 NOTE — Telephone Encounter (Signed)
Patient calling with questions about her Mirena. She has questions about her menstrual cycle and reports also having "severe pain" in her left side during her menstrual cycle.

## 2015-03-06 NOTE — Telephone Encounter (Signed)
Spoke with patient. Has Mirena IUD. Inserted 08/14/2014.Patient states that she has "black/brown mucous" for 4-5 days a month. "I only really notice it when I am wiping. I also have been having a sharp pain on my left side that comes and goes when I have the spotting. It happened on Sunday and almost took my breath away." Pain is intermittent and 8/10 when it occurs. Has had no further pain since Sunday. Patient denies any current pain or discomfort. Is not able to feel IUD strings. Advised will need to be seen in office for further evaluation. Patient is agreeable. Appointment scheduled for 4/8 at 9:45am. If symptoms return will call to be seen earlier in office or seek care at local ER.  Routing to provider for final review. Patient agreeable to disposition. Will close encounter

## 2015-03-06 NOTE — Telephone Encounter (Signed)
I prefer to see the patient on 03/08/15 in office for a pelvic ultrasound. (will need precert please.) I agree with recommendations to go to the emergency department for persistent or increased pain or heavy vaginal bleeding.  Please have the patient do a pregnancy test and call back with the result.

## 2015-03-07 NOTE — Telephone Encounter (Signed)
Dr.Silva, ultrasound schedule for 03/08/2015 is currently full. Would you like me to schedule PUS with womens or next available in office?

## 2015-03-07 NOTE — Telephone Encounter (Signed)
If she is pain free, OK to wait until next week for a pelvic ultrasound and recheck appointment on the same day if the patient would like to do both then.  Otherwise, I am happy to see her for at least an office visit this week.

## 2015-03-07 NOTE — Telephone Encounter (Signed)
Spoke with patient. Patient states that she has not had any further pain. Patient would like to proceed with scheduling an ultrasound next week with Dr.Silva. Will return call if symptoms return to be seen sooner. Sonographer will be out of the office nest Thursday morning. Offered patient PUS appointment for Tuesday 4/12 with consult on 4/14 but patient declines. Requesting appointment for the following week. Appointment scheduled for 4/21 at 10:30am with 11am consult with Dr.Silva. Patient is agreeable to date and time. Patient will return call to be seen earlier if symptoms return or will seek care at ED.  Routing to provider for final review. Patient agreeable to disposition. Will close encounter

## 2015-03-07 NOTE — Addendum Note (Signed)
Addended by: Jasmine Awe on: 03/07/2015 02:31 PM   Modules accepted: Orders

## 2015-03-09 ENCOUNTER — Ambulatory Visit: Payer: Self-pay | Admitting: Obstetrics and Gynecology

## 2015-03-22 ENCOUNTER — Ambulatory Visit (INDEPENDENT_AMBULATORY_CARE_PROVIDER_SITE_OTHER): Payer: BLUE CROSS/BLUE SHIELD

## 2015-03-22 ENCOUNTER — Ambulatory Visit (INDEPENDENT_AMBULATORY_CARE_PROVIDER_SITE_OTHER): Payer: BLUE CROSS/BLUE SHIELD | Admitting: Obstetrics and Gynecology

## 2015-03-22 ENCOUNTER — Encounter: Payer: Self-pay | Admitting: Obstetrics and Gynecology

## 2015-03-22 VITALS — BP 100/60 | HR 70 | Ht 64.75 in | Wt 155.0 lb

## 2015-03-22 DIAGNOSIS — Z30431 Encounter for routine checking of intrauterine contraceptive device: Secondary | ICD-10-CM | POA: Diagnosis not present

## 2015-03-22 DIAGNOSIS — R102 Pelvic and perineal pain: Secondary | ICD-10-CM

## 2015-03-22 DIAGNOSIS — M25559 Pain in unspecified hip: Secondary | ICD-10-CM | POA: Diagnosis not present

## 2015-03-22 NOTE — Progress Notes (Signed)
Patient ID: Beth Huerta, female   DOB: [birth year], 29 y.o.   MRN: [id number]  GYNECOLOGY  VISIT   HPI: 29 y.o.   Married  Caucasian  female   G0P0000 with Patient's last menstrual period was 03/08/2015.   here for   Pelvic ultrasound to check IUD.  Had Mirena placed   Has concerns.  Has lower abdominal pain - left greater than right.  Some bad cramping that took her breath away and lasts a couple of hours.  Irregular cycles.  Blood is black and is only with wiping during her cycle and with intense exercise.  Overall symptoms are better.   Some loose stools around the time of her cycle.  History of laparoscopic left ovarian cystectomy.  History of endometriosis.  GYNECOLOGIC HISTORY: Patient's last menstrual period was 03/08/2015.        OB History    Gravida Para Term Preterm AB TAB SAB Ectopic Multiple Living   0 0 0 0 0 0 0 0 0 0          There are no active problems to display for this patient.   Past Medical History  Diagnosis Date  . Kidney stones     rt with lithotripsy  . Depression     and anxiety  . 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 Colonic dysmotility     small bowel  . Anxiety     Past Surgical History  Procedure Laterality Date  . Tonsillectomy    . Colposcopy      CIN II  . Laparoscopic ovarian cystectomy Left 01/24/2014    Procedure: LAPAROSCOPIC OVARIAN LEFT CYSTECTOMY; BIOPSY UTERINE SEROSA;  Surgeon: Jamey Reas de Berton Lan, MD;  Location: [facility];  Service: Gynecology;  Laterality: Left;  . Lysis of adhesion N/A 01/24/2014    Procedure: LYSIS OF ADHESION, fulgeration of endometriosis ;  Surgeon: Jamey Reas de Berton Lan, MD;  Location: [facility];  Service: Gynecology;  Laterality: N/A;    Current Outpatient Prescriptions  Medication Sig Dispense Refill  . escitalopram (LEXAPRO) 10 MG tablet TAKE ONE TABLET BY MOUTH ONCE DAILY 30 tablet 8  . IBUPROFEN PO Take 200 mg by mouth every 4 (four) hours as needed  (menstral pain).     Marland Kitchen levonorgestrel (MIRENA) 20 MCG/24HR IUD 1 each by Intrauterine route once.     No current facility-administered medications for this visit.     ALLERGIES: Review of patient's allergies indicates no known allergies.  Family History  Problem Relation Age of Onset  . Ovarian cancer Maternal Grandmother   . Colon cancer Maternal Grandmother   . Heart disease Paternal Grandfather   . Lung cancer Father   . Ovarian cancer Maternal Aunt     History   Social History  . Marital Status: Married    Spouse Name: N/A  . Number of Children: 0  . Years of Education: N/A   Occupational History  . photographer/ recruiter    Social History Main Topics  . Smoking status: Never Smoker   . Smokeless tobacco: Never Used  . Alcohol Use: 1.2 oz/week    2 Standard drinks or equivalent per week     Comment: 1-2 drinks a week (alcohol, wine or beer)  . Drug Use: No  . Sexual Activity:    Partners: Male    Birth Control/ Protection: IUD     Comment: Mirena inserted 08-14-14   Other Topics Concern  .  Not on file   Social History Narrative    ROS:  Pertinent items are noted in HPI.  PHYSICAL EXAMINATION:    BP 100/60 mmHg  Pulse 70  Ht 5' 4.75" (1.645 m)  Wt 155 lb (70.308 kg)  BMI 25.98 kg/m2  LMP 03/08/2015     General appearance: alert, cooperative and appears stated age  Pelvic ultrasound - images and report reviewed with patient.  Uterus  - IUD in canal.  No masses.  EMS 4.4 mm.  Left ovary with 1.7 cm CL cyst.  Right ovary normal.  Free fluid 21 - 12 mm.   ASSESSMENT  Pelvic pain.  Mirena IUD in normal position.  History of endometriosis.   PLAN  Reassurance regarding bleeding profile.  Discussion of endometriosis and tx options - Depo Lupron or Depo Provera if pain increases. Return for annual exam in 2 months.   An After Visit Summary was printed and given to the patient.  __15____ minutes face to face time of which over 50% was spent  in counseling.

## 2015-05-24 ENCOUNTER — Ambulatory Visit: Payer: BC Managed Care – PPO | Admitting: Obstetrics and Gynecology

## 2015-07-20 ENCOUNTER — Other Ambulatory Visit: Payer: Self-pay | Admitting: Obstetrics and Gynecology

## 2015-07-23 NOTE — Telephone Encounter (Signed)
Medication refill request: Lexapro 10 mg  Last AEX:  05/11/14 with BS  Next AEX: 08/09/15 with BS  Last MMG (if hormonal medication request): n/a Refill authorized: #30  (routed to Dr. Talbert Nan since Dr. Quincy Simmonds is in surgery)

## 2015-08-09 ENCOUNTER — Encounter: Payer: Self-pay | Admitting: Obstetrics and Gynecology

## 2015-08-09 ENCOUNTER — Ambulatory Visit (INDEPENDENT_AMBULATORY_CARE_PROVIDER_SITE_OTHER): Payer: BLUE CROSS/BLUE SHIELD | Admitting: Obstetrics and Gynecology

## 2015-08-09 VITALS — BP 96/58 | HR 70 | Resp 14 | Ht 65.0 in | Wt 154.4 lb

## 2015-08-09 DIAGNOSIS — R6882 Decreased libido: Secondary | ICD-10-CM | POA: Diagnosis not present

## 2015-08-09 DIAGNOSIS — Z01419 Encounter for gynecological examination (general) (routine) without abnormal findings: Secondary | ICD-10-CM

## 2015-08-09 DIAGNOSIS — Z23 Encounter for immunization: Secondary | ICD-10-CM | POA: Diagnosis not present

## 2015-08-09 DIAGNOSIS — F411 Generalized anxiety disorder: Secondary | ICD-10-CM

## 2015-08-09 MED ORDER — BUPROPION HCL ER (XL) 150 MG PO TB24: 150.0000 mg | ORAL_TABLET | Freq: Every day | ORAL | Status: DC

## 2015-08-09 NOTE — Progress Notes (Signed)
Patient ID: Beth Huerta, female   DOB: 1986/08/16, 29 y.o.   MRN: [id number] 29 y.o. [place] Married Caucasian female here for annual exam.    Mirena working better than anything else for period and pain control.  Satisfied with this.  Has some lower pelvic pain and random cramps. Had dark spotting every two weeks and cramping that lasts off and on for one week.  Some pain down the left leg and constipation type symptoms.   Decreased sex drive since starting Lexapro. On Lexapro for 1.5 - 2 years.  Would like to consider other options.  Some anxiety and agitation.  Denies feelings of suicidal and homicidal ideation.   Maternal grandmother and aunt with ovarian cancer.  Mother tested for BRCA and this was negative.   PCP:  Dr. Moshe Cipro [facility] [place])  Patient's last menstrual period was 07/26/2015 (approximate).          Sexually active: Yes.   female The current method of family planning is IUD--Mirena  inserted 08-14-14. Exercising: Yes.    cycling, mixed fit, body pump and kickboxing. Smoker:  no  Health Maintenance: Pap:  05-11-14 Neg:Neg HR HPV History of abnormal Pap:  Yes, ?abnormal pap 2014 in [place];2011 hx of colposcopy with biopsies revealing  CIN II. MMG:  n/a Colonoscopy:  n/a BMD:   n/a  Result  n/a TDaP:  Over 10 years ago Screening Labs:  Hb today: PCP, Urine today: unable to void   reports that she has never smoked. She has never used smokeless tobacco. She reports that she drinks about 1.2 oz of alcohol per week. She reports that she does not use illicit drugs.  Past Medical History  Diagnosis Date  . Kidney stones     rt with lithotripsy  . Depression     and anxiety  . 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 Colonic dysmotility     small bowel  . Anxiety     Past Surgical History  Procedure Laterality Date  . Tonsillectomy    . Colposcopy      CIN II  . Laparoscopic ovarian cystectomy Left 01/24/2014    Procedure: LAPAROSCOPIC  OVARIAN LEFT CYSTECTOMY; BIOPSY UTERINE SEROSA;  Surgeon: Jamey Reas de Berton Lan, MD;  Location: [facility];  Service: Gynecology;  Laterality: Left;  . Lysis of adhesion N/A 01/24/2014    Procedure: LYSIS OF ADHESION, fulgeration of endometriosis ;  Surgeon: Jamey Reas de Berton Lan, MD;  Location: [facility];  Service: Gynecology;  Laterality: N/A;    Current Outpatient Prescriptions  Medication Sig Dispense Refill  . escitalopram (LEXAPRO) 10 MG tablet TAKE ONE TABLET BY MOUTH ONCE DAILY 30 tablet 0  . levonorgestrel (MIRENA) 20 MCG/24HR IUD 1 each by Intrauterine route once.     No current facility-administered medications for this visit.    Family History  Problem Relation Age of Onset  . Ovarian cancer Maternal Grandmother   . Colon cancer Maternal Grandmother   . Heart disease Paternal Grandfather   . Lung cancer Father   . Ovarian cancer Maternal Aunt    No family history of breast cancer.   ROS:  Pertinent items are noted in HPI.  Otherwise, a comprehensive ROS was negative.  Exam:   BP 96/58 mmHg  Pulse 70  Resp 14  Ht 5' 5"  (1.651 m)  Wt 154 lb 6.4 oz (70.035 kg)  BMI 25.69 kg/m2  LMP 07/26/2015 (Approximate)    General  appearance: alert, cooperative and appears stated age Head: Normocephalic, without obvious abnormality, atraumatic Neck: no adenopathy, supple, symmetrical, trachea midline and thyroid normal to inspection and palpation Lungs: clear to auscultation bilaterally Breasts: normal appearance, no masses or tenderness, Inspection negative, No nipple retraction or dimpling, No nipple discharge or bleeding, No axillary or supraclavicular adenopathy Heart: regular rate and rhythm Abdomen: soft, non-tender; bowel sounds normal; no masses,  no organomegaly Extremities: extremities normal, atraumatic, no cyanosis or edema Skin: Skin color, texture, turgor normal. No rashes or lesions Lymph nodes: Cervical, supraclavicular, and axillary nodes  normal. No abnormal inguinal nodes palpated Neurologic: Grossly normal  Pelvic: External genitalia:  no lesions              Urethra:  normal appearing urethra with no masses, tenderness or lesions              Bartholins and Skenes: normal                 Vagina: normal appearing vagina with normal color and discharge, no lesions              Cervix: no lesions and IUD strings seen.              Pap taken: Yes.   Bimanual Exam:  Uterus:  normal size, contour, position, consistency, mobility, non-tender              Adnexa: normal adnexa and no mass, fullness, tenderness              Rectovaginal: No..  Confirms.              Anus:  normal sphincter tone, no lesions  Chaperone was present for exam.  Assessment:   Well woman visit with normal exam. Hx CIN II.  Hx anxiety and depression.  Decreased libido.  Mirena IUD patient.  Doing well overall.  Hx of endometriosis.  FH of ovarian CA.  Plan: Yearly mammogram recommended after age 69.  Recommended self breast exam.  Pap and HR HPV as above. Discussed Calcium, Vitamin D, regular exercise program including cardiovascular and weight bearing exercise. Labs performed.  No..    Labs done with PCP.  Refills given on medications.  Yes.  .  See orders.  Will stop Lexapro and start Wellbutrin XL 150 mg.  Patient understands this treats depression and some anxiety symptoms.  She wants to try due to the decreased libido.  Will consider establishing care with a counselor in [place] area. TDap today.  Will return for a recheck appointment in 6 weeks.  Follow up annually and prn.    After visit summary provided.

## 2015-08-09 NOTE — Patient Instructions (Signed)
EXERCISE AND DIET:  We recommended that you start or continue a regular exercise program for good health. Regular exercise means any activity that makes your heart beat faster and makes you sweat.  We recommend exercising at least 30 minutes per day at least 3 days a week, preferably 4 or 5.  We also recommend a diet low in fat and sugar.  Inactivity, poor dietary choices and obesity can cause diabetes, heart attack, stroke, and kidney damage, among others.    ALCOHOL AND SMOKING:  Women should limit their alcohol intake to no more than 7 drinks/beers/glasses of wine (combined, not each!) per week. Moderation of alcohol intake to this level decreases your risk of breast cancer and liver damage. And of course, no recreational drugs are part of a healthy lifestyle.  And absolutely no smoking or even second hand smoke. Most people know smoking can cause heart and lung diseases, but did you know it also contributes to weakening of your bones? Aging of your skin?  Yellowing of your teeth and nails?  CALCIUM AND VITAMIN D:  Adequate intake of calcium and Vitamin D are recommended.  The recommendations for exact amounts of these supplements seem to change often, but generally speaking 600 mg of calcium (either carbonate or citrate) and 800 units of Vitamin D per day seems prudent. Certain women may benefit from higher intake of Vitamin D.  If you are among these women, your doctor will have told you during your visit.    PAP SMEARS:  Pap smears, to check for cervical cancer or precancers,  have traditionally been done yearly, although recent scientific advances have shown that most women can have pap smears less often.  However, every woman still should have a physical exam from her gynecologist every year. It will include a breast check, inspection of the vulva and vagina to check for abnormal growths or skin changes, a visual exam of the cervix, and then an exam to evaluate the size and shape of the uterus and  ovaries.  And after 31 years of age, a rectal exam is indicated to check for rectal cancers. We will also provide age appropriate advice regarding health maintenance, like when you should have certain vaccines, screening for sexually transmitted diseases, bone density testing, colonoscopy, mammograms, etc.   MAMMOGRAMS:  All women over 40 years old should have a yearly mammogram. Many facilities now offer a "3D" mammogram, which may cost around $50 extra out of pocket. If possible,  we recommend you accept the option to have the 3D mammogram performed.  It both reduces the number of women who will be called back for extra views which then turn out to be normal, and it is better than the routine mammogram at detecting truly abnormal areas.    COLONOSCOPY:  Colonoscopy to screen for colon cancer is recommended for all women at age 50.  We know, you hate the idea of the prep.  We agree, BUT, having colon cancer and not knowing it is worse!!  Colon cancer so often starts as a polyp that can be seen and removed at colonscopy, which can quite literally save your life!  And if your first colonoscopy is normal and you have no family history of colon cancer, most women don't have to have it again for 10 years.  Once every ten years, you can do something that may end up saving your life, right?  We will be happy to help you get it scheduled when you are ready.    Be sure to check your insurance coverage so you understand how much it will cost.  It may be covered as a preventative service at no cost, but you should check your particular policy.     Bupropion extended-release tablets (Depression/Mood Disorders) What is this medicine? BUPROPION (byoo PROE pee on) is used to treat depression. This medicine may be used for other purposes; ask your health care provider or pharmacist if you have questions. COMMON BRAND NAME(S): Aplenzin, Budeprion XL, Forfivo XL, Wellbutrin XL What should I tell my health care provider  before I take this medicine? They need to know if you have any of these conditions: -an eating disorder, such as anorexia or bulimia -bipolar disorder or psychosis -diabetes or high blood sugar, treated with medication -glaucoma -head injury or brain tumor -heart disease, previous heart attack, or irregular heart beat -high blood pressure -kidney or liver disease -seizures (convulsions) -suicidal thoughts or a previous suicide attempt -Tourette's syndrome -weight loss -an unusual or allergic reaction to bupropion, other medicines, foods, dyes, or preservatives -breast-feeding -pregnant or trying to become pregnant How should I use this medicine? Take this medicine by mouth with a glass of water. Follow the directions on the prescription label. You can take it with or without food. If it upsets your stomach, take it with food. Do not crush, chew, or cut these tablets. This medicine is taken once daily at the same time each day. Do not take your medicine more often than directed. Do not stop taking this medicine suddenly except upon the advice of your doctor. Stopping this medicine too quickly may cause serious side effects or your condition may worsen. A special MedGuide will be given to you by the pharmacist with each prescription and refill. Be sure to read this information carefully each time. Talk to your pediatrician regarding the use of this medicine in children. Special care may be needed. Overdosage: If you think you have taken too much of this medicine contact a poison control center or emergency room at once. NOTE: This medicine is only for you. Do not share this medicine with others. What if I miss a dose? If you miss a dose, skip the missed dose and take your next tablet at the regular time. Do not take double or extra doses. What may interact with this medicine? Do not take this medicine with any of the following medications: -linezolid -MAOIs like Azilect, Carbex, Eldepryl,  Marplan, Nardil, and Parnate -methylene blue (injected into a vein) -other medicines that contain bupropion like Zyban This medicine may also interact with the following medications: -alcohol -certain medicines for anxiety or sleep -certain medicines for blood pressure like metoprolol, propranolol -certain medicines for depression or psychotic disturbances -certain medicines for HIV or AIDS like efavirenz, lopinavir, nelfinavir, ritonavir -certain medicines for irregular heart beat like propafenone, flecainide -certain medicines for Parkinson's disease like amantadine, levodopa -certain medicines for seizures like carbamazepine, phenytoin, phenobarbital -cimetidine -clopidogrel -cyclophosphamide -furazolidone -isoniazid -nicotine -orphenadrine -procarbazine -steroid medicines like prednisone or cortisone -stimulant medicines for attention disorders, weight loss, or to stay awake -tamoxifen -theophylline -thiotepa -ticlopidine -tramadol -warfarin This list may not describe all possible interactions. Give your health care provider a list of all the medicines, herbs, non-prescription drugs, or dietary supplements you use. Also tell them if you smoke, drink alcohol, or use illegal drugs. Some items may interact with your medicine. What should I watch for while using this medicine? Tell your doctor if your symptoms do not get better or if they  get worse. Visit your doctor or health care professional for regular checks on your progress. Because it may take several weeks to see the full effects of this medicine, it is important to continue your treatment as prescribed by your doctor. Patients and their families should watch out for new or worsening thoughts of suicide or depression. Also watch out for sudden changes in feelings such as feeling anxious, agitated, panicky, irritable, hostile, aggressive, impulsive, severely restless, overly excited and hyperactive, or not being able to sleep.  If this happens, especially at the beginning of treatment or after a change in dose, call your health care professional. Avoid alcoholic drinks while taking this medicine. Drinking large amounts of alcoholic beverages, using sleeping or anxiety medicines, or quickly stopping the use of these agents while taking this medicine may increase your risk for a seizure. Do not drive or use heavy machinery until you know how this medicine affects you. This medicine can impair your ability to perform these tasks. Do not take this medicine close to bedtime. It may prevent you from sleeping. Your mouth may get dry. Chewing sugarless gum or sucking hard candy, and drinking plenty of water may help. Contact your doctor if the problem does not go away or is severe. The tablet shell for some brands of this medicine does not dissolve. This is normal. The tablet shell may appear whole in the stool. This is not a cause for concern. What side effects may I notice from receiving this medicine? Side effects that you should report to your doctor or health care professional as soon as possible: -allergic reactions like skin rash, itching or hives, swelling of the face, lips, or tongue -breathing problems -changes in vision -confusion -fast or irregular heartbeat -hallucinations -increased blood pressure -redness, blistering, peeling or loosening of the skin, including inside the mouth -seizures -suicidal thoughts or other mood changes -unusually weak or tired -vomiting Side effects that usually do not require medical attention (report to your doctor or health care professional if they continue or are bothersome): -change in sex drive or performance -constipation -headache -loss of appetite -nausea -tremors -weight loss This list may not describe all possible side effects. Call your doctor for medical advice about side effects. You may report side effects to FDA at 1-800-FDA-1088. Where should I keep my  medicine? Keep out of the reach of children. Store at room temperature between 15 and 30 degrees C (59 and 86 degrees F). Throw away any unused medicine after the expiration date. NOTE: This sheet is a summary. It may not cover all possible information. If you have questions about this medicine, talk to your doctor, pharmacist, or health care provider.  2015, Elsevier/Gold Standard. (2013-06-10 12:39:42)

## 2015-08-13 LAB — IPS PAP TEST WITH HPV

## 2015-09-20 ENCOUNTER — Encounter: Payer: Self-pay | Admitting: Obstetrics and Gynecology

## 2015-09-20 ENCOUNTER — Ambulatory Visit (INDEPENDENT_AMBULATORY_CARE_PROVIDER_SITE_OTHER): Payer: BLUE CROSS/BLUE SHIELD | Admitting: Obstetrics and Gynecology

## 2015-09-20 VITALS — BP 110/62 | HR 80 | Ht 65.0 in | Wt 153.2 lb

## 2015-09-20 DIAGNOSIS — F411 Generalized anxiety disorder: Secondary | ICD-10-CM

## 2015-09-20 DIAGNOSIS — R635 Abnormal weight gain: Secondary | ICD-10-CM

## 2015-09-20 MED ORDER — BUPROPION HCL ER (XL) 150 MG PO TB24: 150.0000 mg | ORAL_TABLET | Freq: Every day | ORAL | Status: DC

## 2015-09-20 NOTE — Progress Notes (Signed)
Patient ID: Beth Huerta, female   DOB: November 09, 1986, 29 y.o.   MRN: [id number] GYNECOLOGY  VISIT   HPI: 29 y.o.   Married  Caucasian  female   [id number] with No LMP recorded. Patient is not currently having periods (Reason: IUD).   here for 6 week follow-up after starting Wellbutrin.   Switched from Lexapro due to decreased sex drive and some increased anxiety and agitation. Notes that her sex drive has increased.  Took a few weeks to feel stable again on Wellbutrin.  Had GI upset, stomach pain, which has resolved.   Working out and having difficulty with weight loss.   GYNECOLOGIC HISTORY: No LMP recorded. Patient is not currently having periods (Reason: IUD). Contraception: Mirena IUD inserted 08-14-14. Menopausal hormone therapy: n/a Last mammogram: n/a Last pap smear: 08-09-15 Neg:Neg HR HPV        OB History    Gravida Para Term Preterm AB TAB SAB Ectopic Multiple Living   0 0 0 0 0 0 0 0 0 0          There are no active problems to display for this patient.   Past Medical History  Diagnosis Date  . Kidney stones     rt with lithotripsy  . Depression     and anxiety  . 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 Colonic dysmotility     small bowel  . Anxiety     Past Surgical History  Procedure Laterality Date  . Tonsillectomy    . Colposcopy      CIN II  . Laparoscopic ovarian cystectomy Left 01/24/2014    Procedure: LAPAROSCOPIC OVARIAN LEFT CYSTECTOMY; BIOPSY UTERINE SEROSA;  Surgeon: Jamey Reas de Berton Lan, MD;  Location: [facility];  Service: Gynecology;  Laterality: Left;  . Lysis of adhesion N/A 01/24/2014    Procedure: LYSIS OF ADHESION, fulgeration of endometriosis ;  Surgeon: Jamey Reas de Berton Lan, MD;  Location: [facility];  Service: Gynecology;  Laterality: N/A;    Current Outpatient Prescriptions  Medication Sig Dispense Refill  . buPROPion (WELLBUTRIN XL) 150 MG 24 hr tablet Take 1 tablet (150 mg total) by mouth daily. 30  tablet 2  . levonorgestrel (MIRENA) 20 MCG/24HR IUD 1 each by Intrauterine route once.     No current facility-administered medications for this visit.     ALLERGIES: Review of patient's allergies indicates no known allergies.  Family History  Problem Relation Age of Onset  . Ovarian cancer Maternal Grandmother   . Colon cancer Maternal Grandmother   . Heart disease Paternal Grandfather   . Lung cancer Father   . Ovarian cancer Maternal Aunt     Social History   Social History  . Marital Status: Married    Spouse Name: N/A  . Number of Children: 0  . Years of Education: N/A   Occupational History  . photographer/ recruiter    Social History Main Topics  . Smoking status: Never Smoker   . Smokeless tobacco: Never Used  . Alcohol Use: 1.2 oz/week    2 Standard drinks or equivalent per week     Comment: 1-2 drinks a week (alcohol, wine or beer)  . Drug Use: No  . Sexual Activity:    Partners: Male    Birth Control/ Protection: IUD     Comment: Mirena inserted 08-14-14   Other Topics Concern  . Not on file   Social History Narrative    ROS:  Pertinent items are noted in HPI.  PHYSICAL EXAMINATION:    BP 110/62 mmHg  Pulse 80  Ht 5\' 5"  (1.651 m)  Wt 153 lb 3.2 oz (69.491 kg)  BMI 25.49 kg/m2    General appearance: alert, cooperative and appears stated age   Chaperone was present for exam.  ASSESSMENT  Anxiety.  Doing well on Wellbutrin XL 150 mg daily.  Difficulty with weight loss.   PLAN  Counseled regarding Wellbutrin XL.  Will continue on Wellbutrin XL 150 mg.  New Rx for one year to pharmacy.  She knows that we can increase to 300 mg daily, but that she will need to contact office first.  Discussed modification of diet through small steps that will reduce calories and result in weight loss.  Follow up for annual exam and prn.    An After Visit Summary was printed and given to the patient.  _15_____ minutes face to face time of which over 50% was  spent in counseling.

## 2015-10-17 ENCOUNTER — Encounter: Payer: Self-pay | Admitting: Obstetrics and Gynecology

## 2015-10-17 ENCOUNTER — Telehealth: Payer: Self-pay

## 2015-10-17 MED ORDER — BUPROPION HCL ER (XL) 300 MG PO TB24: 300.0000 mg | ORAL_TABLET | Freq: Every day | ORAL | Status: DC

## 2015-10-17 NOTE — Telephone Encounter (Signed)
Non-Urgent Medical Question  Message [id number]   From  Baxter Flattery   To  Nunzio Cobbs, MD   Sent  10/17/2015 12:21 PM     Hello!  Dr.Silva and I had discussed going on a higher dosage of my medication (generic Wellbutrin) at my last visit.  I wanted to reach out to her and see if I could start that.  If so, do I take both at the same time or separate them?  Thank you!  Beth Huerta      Responsible Party    Pool - [facility] Pool No one has taken responsibility for this message.     No actions have been taken on this message.     Patient was last seen in office on 09/20/2015 with Dr.Silva. Per OV note patient may increase Wellbutrin XL 150 mg dose to 300 mg daily, but needs to contact the office first. Dr.Silva, okay for patient to increase dosage at this time?

## 2015-10-17 NOTE — Telephone Encounter (Signed)
Telephone encounter created to discuss mychart message with Dr.Silva. 

## 2015-10-17 NOTE — Telephone Encounter (Signed)
Spoke with patient. Advised of message as seen below from Beth Huerta. Patient is agreeable. Rx for Wellbutrin XL 300 mg #90 2RF sent to pharmacy on file. Patient is agreeable and verbalizes understanding. Aware she can take Wellbutrin XL 150 mg two tablets once per day until the pills she current has run out.  Routing to provider for final review. Patient agreeable to disposition. Will close encounter.

## 2015-10-17 NOTE — Telephone Encounter (Signed)
Please let patient know that she should take the full 300 mg of the Wellbutrin XL at the same time daily.  Please send a new Rx through to her pharmacy and give enough until she is due for her annual exam with me.

## 2015-11-14 ENCOUNTER — Telehealth: Payer: Self-pay | Admitting: Obstetrics and Gynecology

## 2015-11-14 NOTE — Telephone Encounter (Signed)
Patient would like to change her Welbutrin prescription. Patient says "it is not working for her".

## 2015-11-15 NOTE — Telephone Encounter (Signed)
Spoke with patient. She feels as though Wellbutrin XL 300 MG is causing her to have increased anxiety and feels as though "I am walking around in a fog." She states her anxiety is worse than it was before. No thoughts of SI/HI.   Discussed with patient may need psychiatry management of medications at this time as Dr. Quincy Simmonds has made changes to her medications. Patient agreeable to this and states that she has discussed this with Dr. Quincy Simmonds previously. Patient lives in [place] so she will review her insurance and providers available. Advised this can be self referral.   Patient would like to decrease back to Wellbutrin XL150 mg po daily.   Advised will send message to Dr. Quincy Simmonds and return call.  Patient agreeable to plan.

## 2015-11-15 NOTE — Telephone Encounter (Signed)
I agree with reducing the Wellbutrin XL back down to 150 mg daily until she can get in to see psychiatrist.  Please refill for at least 3 months to pharmacy of choice.

## 2016-11-27 NOTE — Progress Notes (Signed)
30 y.o. G54P0000 Married Caucasian female here for annual exam.    Having bleeding with intercourse 50 - 70% of the time.  Having some increased pain with intercourse as well.  Menses are are black bloody drainage but a large amount.  This occurred 2 - 3 times in Oct. And Nov. This was associated by cramps and pressure only on the right side.  Is still having some pain even if she is not bleeding.   Really "loves" her IUD.   No change in sexual partners.  Negative GC/CT in 2015.   Declines childbearing.   Wants routine labs done.  FH of ovarian and colon cancer.  Mother has had negative genetic testing.  Patient has been interested in this in the past.   PCP:  Dr. Moshe Cipro ([place], [place])  No LMP recorded. Patient is not currently having periods (Reason: IUD).     Period Cycle (Days):  (no cycles due to Mirena IUD)     Sexually active: Yes.   maleThe current method of family planning is IUD--Mirena inserted 08-14-14.    Exercising: Yes.    Cycle, body pump and step Smoker:  no  Health Maintenance: Pap:  08-09-15 Neg:Neg HR HPV.  05/11/14 - neg and neg HR HPV. 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 MMG:  n/a Colonoscopy:  n/a BMD:   n/a  Result  n/a TDaP:  08-09-15 Gardasil:   no   Screening Labs:  Hb today: 12.9, Urine today: Neg   reports that she has never smoked. She has never used smokeless tobacco. She reports that she drinks about 1.2 oz of alcohol per week . She reports that she does not use drugs.  Past Medical History:  Diagnosis Date  . 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 Anxiety   . Colonic dysmotility    small bowel  . Depression    and anxiety  . Kidney stones    rt with lithotripsy    Past Surgical History:  Procedure Laterality Date  . COLPOSCOPY     CIN II  . LAPAROSCOPIC OVARIAN CYSTECTOMY Left 01/24/2014   Procedure: LAPAROSCOPIC OVARIAN LEFT CYSTECTOMY; BIOPSY UTERINE SEROSA;   Surgeon: Jamey Reas de Berton Lan, MD;  Location: [facility];  Service: Gynecology;  Laterality: Left;  . LYSIS OF ADHESION N/A 01/24/2014   Procedure: LYSIS OF ADHESION, fulgeration of endometriosis ;  Surgeon: Jamey Reas de Berton Lan, MD;  Location: [facility];  Service: Gynecology;  Laterality: N/A;  . TONSILLECTOMY      Current Outpatient Prescriptions  Medication Sig Dispense Refill  . escitalopram (LEXAPRO) 20 MG tablet Take 1 tablet by mouth daily.    Marland Kitchen levonorgestrel (MIRENA) 20 MCG/24HR IUD 1 each by Intrauterine route once.    Marland Kitchen LORazepam (ATIVAN) 0.5 MG tablet Take 1 tablet by mouth as needed. Prn flying     No current facility-administered medications for this visit.     Family History  Problem Relation Age of Onset  . Ovarian cancer Maternal Grandmother   . Colon cancer Maternal Grandmother   . Heart disease Paternal Grandfather   . Lung cancer Father   . Ovarian cancer Maternal Aunt     ROS:  Pertinent items are noted in HPI.  Otherwise, a comprehensive ROS was negative.  Exam:   BP 100/60 (BP Location: Right Arm, Patient Position: Sitting, Cuff Size: Normal)   Pulse 66  Resp 16   Ht 5\' 5"  (1.651 m)   Wt 150 lb 6.4 oz (68.2 kg)   BMI 25.03 kg/m     General appearance: alert, cooperative and appears stated age Head: Normocephalic, without obvious abnormality, atraumatic Neck: no adenopathy, supple, symmetrical, trachea midline and thyroid normal to inspection and palpation Lungs: clear to auscultation bilaterally Breasts: normal appearance, no masses or tenderness, No nipple retraction or dimpling, No nipple discharge or bleeding, No axillary or supraclavicular adenopathy Heart: regular rate and rhythm Abdomen: soft, non-tender; no masses, no organomegaly Extremities: extremities normal, atraumatic, no cyanosis or edema Skin: Skin color, texture, turgor normal. No rashes or lesions Lymph nodes: Cervical, supraclavicular, and axillary nodes  normal. No abnormal inguinal nodes palpated Neurologic: Grossly normal  Pelvic: External genitalia:  no lesions              Urethra:  normal appearing urethra with no masses, tenderness or lesions              Bartholins and Skenes: normal                 Vagina: normal appearing vagina with normal color and discharge, no lesions              Cervix: no lesions.  Friable with pap.               Pap taken: Yes.   Bimanual Exam:  Uterus:  normal size, contour, position, consistency, mobility, non-tender              Adnexa: no mass, fullness, tenderness on the right.  Tenderness on the left, no mass.              Chaperone was present for exam.  Assessment:   Well woman visit with normal exam. Mirena IUD patient.  Pelvic pain.  Postcoital bleeding.  Hx endometriosis.  FH of ovarian and colon cancer.   Plan: Mammogram screening discussed. Recommended self breast awareness. Pap and HR HPV as above. Guidelines for Calcium, Vitamin D, regular exercise program including cardiovascular and weight bearing exercise. Return for pelvic ultrasound.  May consider a course of combined OCPs for 3 - 6 months.  Routine labs.  Declines GC/CT.  Referral for genetic counseling and possible testing.  Follow up annually and prn.      After visit summary provided.

## 2016-12-01 HISTORY — DX: Gilbert syndrome: E80.4

## 2016-12-03 ENCOUNTER — Ambulatory Visit (INDEPENDENT_AMBULATORY_CARE_PROVIDER_SITE_OTHER): Payer: BLUE CROSS/BLUE SHIELD | Admitting: Obstetrics and Gynecology

## 2016-12-03 ENCOUNTER — Encounter: Payer: Self-pay | Admitting: Obstetrics and Gynecology

## 2016-12-03 ENCOUNTER — Telehealth: Payer: Self-pay | Admitting: Obstetrics and Gynecology

## 2016-12-03 VITALS — BP 100/60 | HR 66 | Resp 16 | Ht 65.0 in | Wt 150.4 lb

## 2016-12-03 DIAGNOSIS — Z01419 Encounter for gynecological examination (general) (routine) without abnormal findings: Secondary | ICD-10-CM | POA: Diagnosis not present

## 2016-12-03 DIAGNOSIS — Z8041 Family history of malignant neoplasm of ovary: Secondary | ICD-10-CM

## 2016-12-03 DIAGNOSIS — Z800 Family history of malignant neoplasm of digestive organs: Secondary | ICD-10-CM

## 2016-12-03 DIAGNOSIS — Z0000 Encounter for general adult medical examination without abnormal findings: Secondary | ICD-10-CM | POA: Diagnosis not present

## 2016-12-03 DIAGNOSIS — M25559 Pain in unspecified hip: Secondary | ICD-10-CM

## 2016-12-03 DIAGNOSIS — R17 Unspecified jaundice: Secondary | ICD-10-CM

## 2016-12-03 LAB — CBC
HEMATOCRIT: 39.9 % (ref 35.0–45.0)
Hemoglobin: 13.2 g/dL (ref 11.7–15.5)
MCH: 32 pg (ref 27.0–33.0)
MCHC: 33.1 g/dL (ref 32.0–36.0)
MCV: 96.8 fL (ref 80.0–100.0)
MPV: 10.2 fL (ref 7.5–12.5)
Platelets: 223 10*3/uL (ref 140–400)
RBC: 4.12 MIL/uL (ref 3.80–5.10)
RDW: 12.4 % (ref 11.0–15.0)
WBC: 10.3 10*3/uL (ref 3.8–10.8)

## 2016-12-03 LAB — POCT URINALYSIS DIPSTICK
BILIRUBIN UA: NEGATIVE
Blood, UA: NEGATIVE
Glucose, UA: NEGATIVE
Ketones, UA: NEGATIVE
LEUKOCYTES UA: NEGATIVE
NITRITE UA: NEGATIVE
PH UA: 5
Protein, UA: NEGATIVE
Urobilinogen, UA: NEGATIVE

## 2016-12-03 LAB — TSH: TSH: 1.3 mIU/L

## 2016-12-03 LAB — HEMOGLOBIN, FINGERSTICK: Hemoglobin, fingerstick: 12.9 g/dL (ref 12.0–15.0)

## 2016-12-03 NOTE — Telephone Encounter (Signed)
Called patient to regarding recommended ultrasound. Left Voicemail requesting a call back. Upon return call, will need to obtain new insurance information for pre-certification purposes

## 2016-12-03 NOTE — Patient Instructions (Signed)

## 2016-12-04 LAB — COMPREHENSIVE METABOLIC PANEL
ALBUMIN: 4.2 g/dL (ref 3.6–5.1)
ALT: 27 U/L (ref 6–29)
AST: 22 U/L (ref 10–30)
Alkaline Phosphatase: 65 U/L (ref 33–115)
BUN: 9 mg/dL (ref 7–25)
CALCIUM: 9.4 mg/dL (ref 8.6–10.2)
CO2: 28 mmol/L (ref 20–31)
Chloride: 106 mmol/L (ref 98–110)
Creat: 0.66 mg/dL (ref 0.50–1.10)
Glucose, Bld: 63 mg/dL — ABNORMAL LOW (ref 65–99)
POTASSIUM: 4.5 mmol/L (ref 3.5–5.3)
Sodium: 142 mmol/L (ref 135–146)
Total Bilirubin: 1.6 mg/dL — ABNORMAL HIGH (ref 0.2–1.2)
Total Protein: 6.8 g/dL (ref 6.1–8.1)

## 2016-12-04 LAB — LIPID PANEL
CHOLESTEROL: 139 mg/dL (ref <200)
HDL: 63 mg/dL (ref >50)
LDL Cholesterol: 65 mg/dL (ref <100)
TRIGLYCERIDES: 55 mg/dL (ref <150)
Total CHOL/HDL Ratio: 2.2 Ratio (ref <5.0)
VLDL: 11 mg/dL (ref <30)

## 2016-12-04 LAB — VITAMIN D 25 HYDROXY (VIT D DEFICIENCY, FRACTURES): VIT D 25 HYDROXY: 27 ng/mL — AB (ref 30–100)

## 2016-12-04 NOTE — Addendum Note (Signed)
Addended by: Yisroel Ramming, BROOK E on: 12/04/2016 10:25 AM   Modules accepted: Orders

## 2016-12-05 LAB — IPS PAP TEST WITH HPV

## 2016-12-09 ENCOUNTER — Telehealth: Payer: Self-pay

## 2016-12-09 MED ORDER — DOXYCYCLINE HYCLATE 100 MG PO CAPS: 100.0000 mg | ORAL_CAPSULE | Freq: Two times a day (BID) | ORAL | 0 refills | Status: DC

## 2016-12-09 NOTE — Telephone Encounter (Signed)
-----   Message from Nunzio Cobbs, MD sent at 12/08/2016  4:23 PM EST ----- Pap is negative as is HR HPV.   Please enter pap recall - 02.   Pap does show inflammation.  Patient is having persistent postcoital bleeding.  I do recommend a course of Doxycycline 100 mg po bid for 7 days to treat the inflammation. Take with food.  We talked about GC/CT testing and decided she did not need this due to no change in partner and negative testing in 2015. If her post coital bleeding and pain resolve with the course of the abx, she does not then need the pelvic ultrasound.  Cc- Marisa Sprinkles

## 2016-12-09 NOTE — Telephone Encounter (Signed)
Spoke with patient. Advised of message as seen below from [place]. Patient is agreeable and verbalizes understanding. Rx for Doxycycline 100 mg po BID x 7 days #14 0RF sent to pharmacy on file. Patient is agreeable. Aware is pain and post coital bleeding resolves with antibiotics she does not need to keep her appointment for her PUS. Patient will monitor symptoms and return call regarding that appointment after completing antibiotics.  Routing to provider for final review. Patient agreeable to disposition. Will close encounter.

## 2016-12-10 NOTE — Telephone Encounter (Signed)
Spoke with patient regarding benefit for ultrasound. Patient understood and agreeable. Patient ready to schedule. Patient scheduled 12/25/16 with Dr Quincy Simmonds. Patient aware of date, arrival time and cancellation policy. No further questions. Ok to close

## 2016-12-25 ENCOUNTER — Encounter: Payer: Self-pay | Admitting: Genetic Counselor

## 2016-12-25 ENCOUNTER — Other Ambulatory Visit: Payer: Self-pay

## 2016-12-25 ENCOUNTER — Ambulatory Visit (INDEPENDENT_AMBULATORY_CARE_PROVIDER_SITE_OTHER): Payer: BLUE CROSS/BLUE SHIELD | Admitting: Obstetrics and Gynecology

## 2016-12-25 ENCOUNTER — Ambulatory Visit (INDEPENDENT_AMBULATORY_CARE_PROVIDER_SITE_OTHER): Payer: BLUE CROSS/BLUE SHIELD

## 2016-12-25 ENCOUNTER — Encounter: Payer: Self-pay | Admitting: Obstetrics and Gynecology

## 2016-12-25 ENCOUNTER — Ambulatory Visit (HOSPITAL_BASED_OUTPATIENT_CLINIC_OR_DEPARTMENT_OTHER): Payer: Self-pay | Admitting: Genetic Counselor

## 2016-12-25 VITALS — BP 100/60 | HR 60 | Ht 65.0 in | Wt 147.0 lb

## 2016-12-25 DIAGNOSIS — M25559 Pain in unspecified hip: Secondary | ICD-10-CM

## 2016-12-25 DIAGNOSIS — R17 Unspecified jaundice: Secondary | ICD-10-CM

## 2016-12-25 DIAGNOSIS — Z8041 Family history of malignant neoplasm of ovary: Secondary | ICD-10-CM | POA: Insufficient documentation

## 2016-12-25 DIAGNOSIS — R8290 Unspecified abnormal findings in urine: Secondary | ICD-10-CM | POA: Diagnosis not present

## 2016-12-25 DIAGNOSIS — N930 Postcoital and contact bleeding: Secondary | ICD-10-CM | POA: Diagnosis not present

## 2016-12-25 DIAGNOSIS — Z800 Family history of malignant neoplasm of digestive organs: Secondary | ICD-10-CM

## 2016-12-25 HISTORY — DX: Unspecified jaundice: R17

## 2016-12-25 LAB — HEPATIC FUNCTION PANEL
ALK PHOS: 53 U/L (ref 33–115)
ALT: 27 U/L (ref 6–29)
AST: 22 U/L (ref 10–30)
Albumin: 4.1 g/dL (ref 3.6–5.1)
BILIRUBIN INDIRECT: 1.5 mg/dL — AB (ref 0.2–1.2)
Bilirubin, Direct: 0.4 mg/dL — ABNORMAL HIGH (ref <=0.2)
TOTAL PROTEIN: 6.8 g/dL (ref 6.1–8.1)
Total Bilirubin: 1.9 mg/dL — ABNORMAL HIGH (ref 0.2–1.2)

## 2016-12-25 LAB — POCT URINALYSIS DIPSTICK
BILIRUBIN UA: NEGATIVE
GLUCOSE UA: NEGATIVE
Ketones, UA: NEGATIVE
Nitrite, UA: NEGATIVE
Protein, UA: NEGATIVE
RBC UA: NEGATIVE
UROBILINOGEN UA: NEGATIVE
pH, UA: 5

## 2016-12-25 MED ORDER — SULFAMETHOXAZOLE-TRIMETHOPRIM 800-160 MG PO TABS: 1.0000 | ORAL_TABLET | Freq: Two times a day (BID) | ORAL | 0 refills | Status: DC

## 2016-12-25 MED ORDER — ESTRADIOL 0.1 MG/GM VA CREA: TOPICAL_CREAM | VAGINAL | 0 refills | Status: DC

## 2016-12-25 NOTE — Progress Notes (Signed)
Patient ID: Beth Huerta, female   DOB: July 22, 1986, 31 y.o.   MRN: [id number] GYNECOLOGY  VISIT   HPI: 31 y.o.   Married  Caucasian  female   G0P0000 with No LMP recorded. Patient is not currently having periods (Reason: IUD).   here for pelvic ultrasound for pelvic pain and post coital bleeding.    Patient complaining of right flank pain and concerned about possible kidney stone.  Finished a course of doxycyline for postcoital bleeding and pap showing inflammation.  Has continued post coital bleeding.  Right flank pain.  Thought she was passing a kidney stone.  Starting to have dysuria.   Urine Dip:1+WBCs  GYNECOLOGIC HISTORY: No LMP recorded. Patient is not currently having periods (Reason: IUD). Contraception:  Mirena IUD inserted 08-14-14 Menopausal hormone therapy:  n/a Last mammogram:  n/a Last pap smear:   08-09-15 Neg/Neg HR HPV        OB History    Gravida Para Term Preterm AB Living   0 0 0 0 0 0   SAB TAB Ectopic Multiple Live Births   0 0 0 0           There are no active problems to display for this patient.   Past Medical History:  Diagnosis Date  . 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 Anxiety   . Colonic dysmotility    small bowel  . Depression    and anxiety  . Kidney stones    rt with lithotripsy    Past Surgical History:  Procedure Laterality Date  . COLPOSCOPY     CIN II  . LAPAROSCOPIC OVARIAN CYSTECTOMY Left 01/24/2014   Procedure: LAPAROSCOPIC OVARIAN LEFT CYSTECTOMY; BIOPSY UTERINE SEROSA;  Surgeon: Jamey Reas de Berton Lan, MD;  Location: [facility];  Service: Gynecology;  Laterality: Left;  . LYSIS OF ADHESION N/A 01/24/2014   Procedure: LYSIS OF ADHESION, fulgeration of endometriosis ;  Surgeon: Jamey Reas de Berton Lan, MD;  Location: [facility];  Service: Gynecology;  Laterality: N/A;  . TONSILLECTOMY      Current Outpatient Prescriptions  Medication Sig Dispense Refill  . doxycycline (VIBRAMYCIN) 100 MG  capsule Take 1 capsule (100 mg total) by mouth 2 (two) times daily. 14 capsule 0  . escitalopram (LEXAPRO) 20 MG tablet Take 1 tablet by mouth daily.    Marland Kitchen levonorgestrel (MIRENA) 20 MCG/24HR IUD 1 each by Intrauterine route once.    Marland Kitchen LORazepam (ATIVAN) 0.5 MG tablet Take 1 tablet by mouth as needed. Prn flying     No current facility-administered medications for this visit.      ALLERGIES: Patient has no known allergies.  Family History  Problem Relation Age of Onset  . Ovarian cancer Maternal Grandmother   . Colon cancer Maternal Grandmother   . Heart disease Paternal Grandfather   . Lung cancer Father   . Ovarian cancer Maternal Aunt     Social History   Social History  . Marital status: Married    Spouse name: N/A  . Number of children: 0  . Years of education: N/A   Occupational History  . photographer/ recruiter    Social History Main Topics  . Smoking status: Never Smoker  . Smokeless tobacco: Never Used  . Alcohol use 1.2 oz/week    2 Standard drinks or equivalent per week     Comment: 1-2 drinks a week (alcohol, wine or beer)  . Drug use: No  . Sexual  activity: Yes    Partners: Male    Birth control/ protection: IUD     Comment: Mirena inserted 08-14-14   Other Topics Concern  . Not on file   Social History Narrative  . No narrative on file    ROS:  Pertinent items are noted in HPI.  PHYSICAL EXAMINATION:    BP 100/60 (BP Location: Right Arm, Patient Position: Sitting, Cuff Size: Normal)   Pulse 60   Ht 5\' 5"  (1.651 m)   Wt 147 lb (66.7 kg)   BMI 24.46 kg/m     General appearance: alert, cooperative and appears stated age   Pelvic ultrasound Uterus with no fibroids.  IUD in normal position. Cervix with 5 mm echogenic focus, polyp? Ovaries normal.  Left ovary with 16 mm follicle. No free fluid.  ASSESSMENT  Post coital bleeding.  Pap with inflammation but otherwise normal. Recent empiric tx with Doxycycline. Dysuria.   UTI.  PLAN  Will observe for further bleeding and if occurs, will return for colposcopy.  I do not recommend pursuing endocervical polyp at this time. I also discussed and prescribed vaginal estrogen cream.  I reviewed potential for breast stimulation of this hormone.  I have instructions for [place]. Urine micro and culture.  Will treat with Bactrim DS po bid for 3 days.  If back pain worsens to ER for evaluation of kidney stone.  Hydrate well!   An After Visit Summary was printed and given to the patient.  __15____ minutes face to face time of which over 50% was spent in counseling.

## 2016-12-25 NOTE — Progress Notes (Signed)
[facility]   Patient Name: Beth Huerta Patient DOB: 02/21/1986 Encounter Date: 12/25/2016  Referring Provider: Garlon Hatchet, MD  Reason for Visit: Evaluate for hereditary susceptibility to cancer   Ms. LINET BRASH, a 31 y.o. female, is being seen at the [facility] due to a family history of cancer. She presents to clinic today to discuss the possibility of a hereditary predisposition to cancer and discuss whether genetic testing is warranted.  History of Present Illness: Ms. Beaston has no personal history of cancer. She reports having endometriosis.  She reports that she undergoes a yearly gynecologic exam clinical breast exam. She did have a colonoscopy about 3 years ago that was negative for polyps.  Past Medical History:  Diagnosis Date  . 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 Anxiety   . Colonic dysmotility    small bowel  . Depression    and anxiety  . Family history of colon cancer   . Family history of ovarian cancer   . Kidney stones    rt with lithotripsy    Past Surgical History:  Procedure Laterality Date  . COLPOSCOPY     CIN II  . LAPAROSCOPIC OVARIAN CYSTECTOMY Left 01/24/2014   Procedure: LAPAROSCOPIC OVARIAN LEFT CYSTECTOMY; BIOPSY UTERINE SEROSA;  Surgeon: Jamey Reas de Berton Lan, MD;  Location: [facility];  Service: Gynecology;  Laterality: Left;  . LYSIS OF ADHESION N/A 01/24/2014   Procedure: LYSIS OF ADHESION, fulgeration of endometriosis ;  Surgeon: Jamey Reas de Berton Lan, MD;  Location: [facility];  Service: Gynecology;  Laterality: N/A;  . TONSILLECTOMY      Social History   Social History  . Marital status: Married    Spouse name: N/A  . Number of children: 0  . Years of education: N/A   Occupational History  . photographer/ recruiter    Social History Main Topics  . Smoking status: Never Smoker  . Smokeless tobacco: Never  Used  . Alcohol use 1.2 oz/week    2 Standard drinks or equivalent per week     Comment: 1-2 drinks a week (alcohol, wine or beer)  . Drug use: No  . Sexual activity: Yes    Partners: Male    Birth control/ protection: IUD     Comment: Mirena inserted 08-14-14   Other Topics Concern  . Not on file   Social History Narrative  . No narrative on file     Family History:  During the visit, a 4-generation pedigree was obtained. Family tree will be scanned in the Media tab in Epic  Significant diagnoses include the following:  Family History  Problem Relation Age of Onset  . Ovarian cancer Maternal Grandmother 64  . Colon cancer Maternal Grandmother 77    deceased 34  . Heart disease Paternal Grandfather   . Lung cancer Father 71    smoker; currently 17  . Ovarian cancer Maternal Aunt     Dx 55s; deceased 62s    Additionally, she has no children. She has one sister (age 42) who has one daughter. Her mother (age 50) is cancer-free. Her mother has two other sisters (ages 65 and 78) in addition to the sister who had ovarian cancer. Her father has only one sister (age 87) who is cancer-free.  Ms. Moyd ancestry is Caucasian - NOS. There is no known Jewish ancestry  and no consanguinity.  Assessment and Plan: Ms. Masse is a 31 y.o. female with a family history of ovarian cancer in her maternal aunt and maternal grandmother who also had colon cancer. This history is not highly suggestive of a hereditary predisposition to cancer. Genetic testing is recommended to rule out that she has a pathogenic mutation that would impact her cancer screening and risk-reduction options. We reviewed the characteristics, features and inheritance patterns of hereditary cancer syndromes with a focus on Lynch syndrome and the BRCA genes. We discussed the process of genetic testing, including insurance coverage and implications of results: positive, negative and Variant of Uncertain Significance. A  negative result will be reassuring.   Ms. Hardt wished to pursue genetic testing and a blood sample will be sent for analysis of the 43 genes on [facility]'s Common Cancers panel (APC, ATM, AXIN2, BARD1, BMPR1A, BRCA1, BRCA2, BRIP1, CDH1, CDKN2A, CHEK2, DICER1, EPCAM, GREM1, HOXB13, KIT, MEN1, MLH1, MSH2, MSH6, MUTYH, NBN, NF1, PALB2, PDGFRA, PMS2, POLD1, POLE, PTEN, RAD50, RAD51C, RAD51D, SDHA, SDHB, SDHC, SDHD, SMAD4, SMARCA4, STK11, TP53, TSC1, TSC2, VHL). Results should be available in approximately 2-4 weeks, at which point we will contact her and address implications for her as well as address genetic testing for at-risk family members, if needed.    Ms. Turski is encouraged to remain in contact with Cancer Genetics annually so that we can update the family history and inform her of any changes in cancer genetics and testing that may be of benefit for this family. Ms.  Huxtable questions were answered to her satisfaction today and she is welcome to call with any additional questions or concerns. Thank you for the referral and allowing [place] to share in the care of your patient.   Dr. Jana Hakim was available for questions concerning this case. Total time spent by Steele Berg, MS, CGC in face-to-face counseling was approximately 30 minutes.   Steele Berg, MS, [place] Certified Genetic Counselor phone: [phone] [email or web address]   ______________________________________________________________________ For Office Staff:  Number of people involved in session: 1 Was an Intern/ student involved with case: no

## 2016-12-25 NOTE — Progress Notes (Signed)
Encounter reviewed by Dr. Brook Amundson C. Silva.  

## 2016-12-26 ENCOUNTER — Encounter: Payer: Self-pay | Admitting: Obstetrics and Gynecology

## 2016-12-26 LAB — URINALYSIS, MICROSCOPIC ONLY
Bacteria, UA: NONE SEEN [HPF]
CASTS: NONE SEEN [LPF]
Crystals: NONE SEEN [HPF]
Yeast: NONE SEEN [HPF]

## 2016-12-26 LAB — URINE CULTURE: Organism ID, Bacteria: NO GROWTH

## 2017-01-01 ENCOUNTER — Encounter: Payer: Self-pay | Admitting: Genetic Counselor

## 2017-01-01 ENCOUNTER — Ambulatory Visit: Payer: Self-pay | Admitting: Genetic Counselor

## 2017-01-01 DIAGNOSIS — Z1379 Encounter for other screening for genetic and chromosomal anomalies: Secondary | ICD-10-CM

## 2017-01-01 HISTORY — DX: Encounter for other screening for genetic and chromosomal anomalies: Z13.79

## 2017-01-01 NOTE — Progress Notes (Signed)
[facility]    Patient Name: Beth Huerta Patient DOB: 05/28/1986 Patient Age: 31 y.o. Encounter Date: 01/01/2017  Referring Provider: Nunzio Cobbs, MD    Beth Huerta was called today to discuss genetic test results. Please see the Genetics note from her visit on 12/25/16 for a detailed discussion of her personal and family history.  Genetic Testing: At the time of Beth Huerta's visit, we recommended she pursue genetic testing of multiple associated with a hereditary predisposition to cancer. Testing included sequencing and deletion/duplication analysis. Testing was normal and did not reveal a mutation in these genes. A copy of the genetic test report will be scanned into Epic under the media tab.  The genes tested were the 43 genes on [facility]'s Common Cancers panel (APC, ATM, AXIN2, BARD1, BMPR1A, BRCA1, BRCA2, BRIP1, CDH1, CDKN2A, CHEK2, DICER1, EPCAM, GREM1, HOXB13, KIT, MEN1, MLH1, MSH2, MSH6, MUTYH, NBN, NF1, PALB2, PDGFRA, PMS2, POLD1, POLE, PTEN, RAD50, RAD51C, RAD51D, SDHA, SDHB, SDHC, SDHD, SMAD4, SMARCA4, STK11, TP53, TSC1, TSC2, VHL).  Since the current test is not perfect, it is possible there may be a gene mutation that current testing cannot detect, but that chance is small. We also discussed that it is possible that a different genetic factor, which was not part of this testing or has not yet been discovered, is responsible for the cancer diagnoses in the family. Again, the likelihood of this is low. No additional testing is recommended at this time.   Cancer Screening: This normal result is reassuring and indicates that Beth Huerta does not likely have an increased risk of cancer due to a mutation in one of these genes. We recommended Beth Huerta continue to follow the cancer screening guidelines provided by her primary physician.   We discussed undergoing breast screenings that are recommended by the [place] for women in the general population, but that may need to be modified based on other risk factors such as dense breasts, biopsy history or family history.  Starting at age 86: Breast awareness - Women should be familiar with their breasts and promptly report changes to their healthcare provider.   Between ages 77-39: Breast exam, risk assessment, and risk reduction counseling by the provider every 1-3 years.   Starting at age 39: Breast exam, risk assessment, and risk reduction counseling by the provider and mammogram every year. The provider may discuss screening with tomosynthesis.  Beth Huerta is also recommended to undergo a yearly gynecologic exam and speak with her GI doctor as to when to have her next colonoscopy. She had a negative colonoscopy about 3 years ago.  Family Members: Family members are at some increased risk of developing cancer, over the general population risk, simply due to the family history. We recommended women have a yearly mammogram beginning at age 34, a yearly clinical breast exam, and perform monthly breast self-exams. A gynecologic exam is recommended yearly. Colon cancer screening is recommended to begin by age 72 for men and women. There are no recommended screening for ovarian cancer that detect this cancer at an early stage.  Any relative who had cancer at a young age or had a particularly rare cancer may also wish to pursue genetic testing. Genetic counselors can be located in other cities, by visiting the website of the [employer] ([employer]ist[employer].se) and Field seismologist for a Dietitian by zip code.   Lastly, cancer genetics  is a rapidly advancing field and it is possible that new genetic tests will be appropriate for her in the future. We encourage her to remain in contact with [place] on an annual basis so we can update her personal and family histories, and let her know of advances in cancer genetics that may  benefit the family. Our contact number was provided. Beth Huerta is welcome to call anytime with additional questions.    Steele Berg, MS, [place] Certified Genetic Counselor phone: [phone]

## 2017-07-24 ENCOUNTER — Encounter (HOSPITAL_COMMUNITY): Payer: Self-pay

## 2018-02-03 NOTE — Progress Notes (Deleted)
32 y.o. G73P0000 Married Caucasian female here for annual exam.    PCP:     No LMP recorded. Patient is not currently having periods (Reason: IUD).           Sexually active: {yes no:314532}  The current method of family planning is IUD--Miena inserted 08-14-14.    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 MMG:  n/a Colonoscopy:  n/a BMD:   n/a  Result  n/a TDaP:  08-09-15 Gardasil:   no HIV: *** Hep C:*** Screening Labs:  Hb today: ***, Urine today: ***   reports that  has never smoked. she has never used smokeless tobacco. She reports that she drinks about 1.2 oz of alcohol per week. She reports that she does not use drugs.  Past Medical History:  Diagnosis Date  . 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 Anxiety   . Colonic dysmotility    small bowel  . Depression    and anxiety  . Elevated bilirubin 12/25/2016  . Family history of colon cancer   . Family history of ovarian cancer   . Genetic testing 01/01/2017   Result: No pathogenic mutations detected  Genes Tested: 43 genes on [employer]'s Common Cancers panel (APC, ATM, AXIN2, BARD1, BMPR1A, BRCA1, BRCA2, BRIP1, CDH1, CDKN2A, CHEK2, DICER1, EPCAM, GREM1, HOXB13, KIT, MEN1, MLH1, MSH2, MSH6, MUTYH, NBN, NF1, PALB2, PDGFRA, PMS2, POLD1, POLE, PTEN, RAD50, RAD51C, RAD51D, SDHA, SDHB, SDHC, SDHD, SMAD4, SMARCA4, STK11, TP53, TSC1, TSC2, VHL).  . Kidney stones    rt with lithotripsy    Past Surgical History:  Procedure Laterality Date  . COLPOSCOPY     CIN II  . LAPAROSCOPIC OVARIAN CYSTECTOMY Left 01/24/2014   Procedure: LAPAROSCOPIC OVARIAN LEFT CYSTECTOMY; BIOPSY UTERINE SEROSA;  Surgeon: Jamey Reas de Berton Lan, MD;  Location: [facility];  Service: Gynecology;  Laterality: Left;  . LYSIS OF ADHESION N/A 01/24/2014   Procedure: LYSIS  OF ADHESION, fulgeration of endometriosis ;  Surgeon: Jamey Reas de Berton Lan, MD;  Location: [facility];  Service: Gynecology;  Laterality: N/A;  . TONSILLECTOMY      Current Outpatient Medications  Medication Sig Dispense Refill  . doxycycline (VIBRAMYCIN) 100 MG capsule Take 1 capsule (100 mg total) by mouth 2 (two) times daily. 14 capsule 0  . estradiol (ESTRACE) 0.1 MG/GM vaginal cream Use 1/2 g vaginally every night for the first 2 weeks, then use 1/2 g vaginally two times per week. 42.5 g 0  . levonorgestrel (MIRENA) 20 MCG/24HR IUD 1 each by Intrauterine route once.    . sulfamethoxazole-trimethoprim (BACTRIM DS) 800-160 MG tablet Take 1 tablet by mouth 2 (two) times daily. One PO BID x 3 days 6 tablet 0   No current facility-administered medications for this visit.     Family History  Problem Relation Age of Onset  . Ovarian cancer Maternal Grandmother 42  . Colon cancer Maternal Grandmother 65       deceased 73  . Heart disease Paternal Grandfather   . Lung cancer Father 83       smoker; currently 6  . Ovarian cancer Maternal Aunt        Dx 23s; deceased 43s    ROS:  Pertinent items are noted in HPI.  Otherwise, a comprehensive ROS was  negative.  Exam:   There were no vitals taken for this visit.    General appearance: alert, cooperative and appears stated age Head: Normocephalic, without obvious abnormality, atraumatic Neck: no adenopathy, supple, symmetrical, trachea midline and thyroid normal to inspection and palpation Lungs: clear to auscultation bilaterally Breasts: normal appearance, no masses or tenderness, No nipple retraction or dimpling, No nipple discharge or bleeding, No axillary or supraclavicular adenopathy Heart: regular rate and rhythm Abdomen: soft, non-tender; no masses, no organomegaly Extremities: extremities normal, atraumatic, no cyanosis or edema Skin: Skin color, texture, turgor normal. No rashes or lesions Lymph nodes: Cervical,  supraclavicular, and axillary nodes normal. No abnormal inguinal nodes palpated Neurologic: Grossly normal  Pelvic: External genitalia:  no lesions              Urethra:  normal appearing urethra with no masses, tenderness or lesions              Bartholins and Skenes: normal                 Vagina: normal appearing vagina with normal color and discharge, no lesions              Cervix: no lesions              Pap taken: {yes no:314532} Bimanual Exam:  Uterus:  normal size, contour, position, consistency, mobility, non-tender              Adnexa: no mass, fullness, tenderness              Rectal exam: {yes no:314532}.  Confirms.              Anus:  normal sphincter tone, no lesions  Chaperone was present for exam.  Assessment:   Well woman visit with normal exam.   Plan: Mammogram screening discussed. Recommended self breast awareness. Pap and HR HPV as above. Guidelines for Calcium, Vitamin D, regular exercise program including cardiovascular and weight bearing exercise.   Follow up annually and prn.   Additional counseling given.  {yes [id number]. _______ minutes face to face time of which over 50% was spent in counseling.    After visit summary provided.

## 2018-02-09 ENCOUNTER — Ambulatory Visit: Payer: BLUE CROSS/BLUE SHIELD | Admitting: Obstetrics and Gynecology

## 2018-02-09 ENCOUNTER — Other Ambulatory Visit (HOSPITAL_COMMUNITY)
Admission: RE | Admit: 2018-02-09 | Discharge: 2018-02-09 | Disposition: A | Discharge status: Home / Self Care | Payer: BLUE CROSS/BLUE SHIELD | Source: Ambulatory Visit | Attending: Obstetrics and Gynecology | Admitting: Obstetrics and Gynecology

## 2018-02-09 ENCOUNTER — Encounter: Payer: Self-pay | Admitting: Obstetrics and Gynecology

## 2018-02-09 ENCOUNTER — Other Ambulatory Visit: Payer: Self-pay

## 2018-02-09 VITALS — BP 90/52 | HR 64 | Resp 14 | Ht 65.5 in | Wt 144.0 lb

## 2018-02-09 DIAGNOSIS — N926 Irregular menstruation, unspecified: Secondary | ICD-10-CM | POA: Diagnosis not present

## 2018-02-09 DIAGNOSIS — K625 Hemorrhage of anus and rectum: Secondary | ICD-10-CM

## 2018-02-09 DIAGNOSIS — Z23 Encounter for immunization: Secondary | ICD-10-CM | POA: Diagnosis not present

## 2018-02-09 DIAGNOSIS — Z01419 Encounter for gynecological examination (general) (routine) without abnormal findings: Secondary | ICD-10-CM

## 2018-02-09 DIAGNOSIS — Z7185 Encounter for immunization safety counseling: Secondary | ICD-10-CM

## 2018-02-09 DIAGNOSIS — Z7189 Other specified counseling: Secondary | ICD-10-CM | POA: Diagnosis not present

## 2018-02-09 NOTE — Progress Notes (Signed)
32 y.o. G26P0000 Married Caucasian female here for annual exam.    Having old blood with menses for up to two weeks. This has occurred 2 - 3 times in the last year.  No real pain.  Last pelvic US was one year ago and was unremarkable.  Possible endocervical polyp.   Having blood in stool with her period for the last 4 months.  Has a GI in [place].  Also has a hiatal hernia for which she is being seen.  PCP:  Dr. Moshe Cipro   No LMP recorded. Patient is not currently having periods (Reason: IUD).           Sexually active: Yes.    The current method of family planning is IUD--Mirena 08-14-14.    Exercising: Yes.    cycle/body pump/running/step. 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 MMG:  n/a Colonoscopy:  2015 prior to endometriosis surgery. BMD:   n/a  Result  n/a TDaP:  08-09-15 Gardasil:   no HIV: Neg Hep C:Neg Screening Labs:    Urine today: not done   reports that  has never smoked. she has never used smokeless tobacco. She reports that she drinks about 1.2 oz of alcohol per week. She reports that she does not use drugs.  Past Medical History:  Diagnosis Date  . 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 Anxiety   . Colonic dysmotility    small bowel  . Depression    and anxiety  . Elevated bilirubin 12/25/2016  . Family history of colon cancer   . Family history of ovarian cancer   . Genetic testing 01/01/2017   Result: No pathogenic mutations detected  Genes Tested: 43 genes on [employer]'s Common Cancers panel (APC, ATM, AXIN2, BARD1, BMPR1A, BRCA1, BRCA2, BRIP1, CDH1, CDKN2A, CHEK2, DICER1, EPCAM, GREM1, HOXB13, KIT, MEN1, MLH1, MSH2, MSH6, MUTYH, NBN, NF1, PALB2, PDGFRA, PMS2, POLD1, POLE, PTEN, RAD50, RAD51C, RAD51D, SDHA, SDHB, SDHC, SDHD, SMAD4, SMARCA4, STK11, TP53, TSC1, TSC2, VHL).  Rosanna Randy syndrome 2018  . Hiatal  hernia   . Kidney stones    rt with lithotripsy    Past Surgical History:  Procedure Laterality Date  . COLPOSCOPY     CIN II  . LAPAROSCOPIC OVARIAN CYSTECTOMY Left 01/24/2014   Procedure: LAPAROSCOPIC OVARIAN LEFT CYSTECTOMY; BIOPSY UTERINE SEROSA;  Surgeon: Jamey Reas de Berton Lan, MD;  Location: [facility];  Service: Gynecology;  Laterality: Left;  . LYSIS OF ADHESION N/A 01/24/2014   Procedure: LYSIS OF ADHESION, fulgeration of endometriosis ;  Surgeon: Jamey Reas de Berton Lan, MD;  Location: [facility];  Service: Gynecology;  Laterality: N/A;  . TONSILLECTOMY      Current Outpatient Medications  Medication Sig Dispense Refill  . escitalopram (LEXAPRO) 20 MG tablet Take 1 capsule by mouth daily.    Marland Kitchen esomeprazole (NEXIUM) 40 MG capsule Take 1 capsule by mouth daily.  1  . levonorgestrel (MIRENA) 20 MCG/24HR IUD 1 each by Intrauterine route once.     No current facility-administered medications for this visit.     Family History  Problem Relation Age of Onset  . Ovarian cancer Maternal Grandmother 68  . Colon cancer Maternal Grandmother 26       deceased 66  . Heart disease Paternal Grandfather   . Lung cancer Father 53  smoker; currently 104  . Ovarian cancer Maternal Aunt        Dx 45s; deceased 27s    ROS:  Pertinent items are noted in HPI.  Otherwise, a comprehensive ROS was negative.  Exam:   BP (!) 90/52 (BP Location: Right Arm, Patient Position: Sitting, Cuff Size: Normal)   Pulse 64   Resp 14   Ht 5' 5.5" (1.664 m)   Wt 144 lb (65.3 kg)   BMI 23.60 kg/m     General appearance: alert, cooperative and appears stated age Head: Normocephalic, without obvious abnormality, atraumatic Neck: no adenopathy, supple, symmetrical, trachea midline and thyroid normal to inspection and palpation Lungs: clear to auscultation bilaterally Breasts: normal appearance, no masses or tenderness, No nipple retraction or dimpling, No nipple discharge or  bleeding, No axillary or supraclavicular adenopathy Heart: regular rate and rhythm Abdomen: soft, non-tender; no masses, no organomegaly Extremities: extremities normal, atraumatic, no cyanosis or edema Skin: Skin color, texture, turgor normal. No rashes or lesions Lymph nodes: Cervical, supraclavicular, and axillary nodes normal. No abnormal inguinal nodes palpated Neurologic: Grossly normal  Pelvic: External genitalia:  no lesions              Urethra:  normal appearing urethra with no masses, tenderness or lesions              Bartholins and Skenes: normal                 Vagina: normal appearing vagina with normal color and discharge, no lesions              Cervix: no lesions.  IUD strings seen.  Small amount of blood in the vagina.                Pap taken: Yes.   Bimanual Exam:  Uterus:  normal size, contour, position, consistency, mobility, non-tender              Adnexa: no mass, fullness, tenderness              Rectal exam: Yes.  .  Confirms.              Anus:  normal sphincter tone, no lesions  Chaperone was present for exam.  Assessment:   Well woman visit with normal exam. Hx CIN II.  Mirena IUD patient.  Abnormal uterine bleeding.  Hx endometriosis.  FH of ovarian and colon cancer.  Negative genetic testing for patient.  Rectal bleeding with menses.   Plan: Mammogram screening discussed. Recommended self breast awareness. Pap and HR HPV as above. Guidelines for Calcium, Vitamin D, regular exercise program including cardiovascular and weight bearing exercise. Return for pelvic ultrasound.  Refer to GI for colonoscopy during her menstrual cycle.  We discussed possible endometriosis as the cause for this.  Gardasil vaccine.  Follow up annually and prn.   After visit summary provided.

## 2018-02-09 NOTE — Progress Notes (Signed)
Opened in error

## 2018-02-09 NOTE — Patient Instructions (Addendum)
EXERCISE AND DIET:  We recommended that you start or continue a regular exercise program for good health. Regular exercise means any activity that makes your heart beat faster and makes you sweat.  We recommend exercising at least 30 minutes per day at least 3 days a week, preferably 4 or 5.  We also recommend a diet low in fat and sugar.  Inactivity, poor dietary choices and obesity can cause diabetes, heart attack, stroke, and kidney damage, among others.    ALCOHOL AND SMOKING:  Women should limit their alcohol intake to no more than 7 drinks/beers/glasses of wine (combined, not each!) per week. Moderation of alcohol intake to this level decreases your risk of breast cancer and liver damage. And of course, no recreational drugs are part of a healthy lifestyle.  And absolutely no smoking or even second hand smoke. Most people know smoking can cause heart and lung diseases, but did you know it also contributes to weakening of your bones? Aging of your skin?  Yellowing of your teeth and nails?  CALCIUM AND VITAMIN D:  Adequate intake of calcium and Vitamin D are recommended.  The recommendations for exact amounts of these supplements seem to change often, but generally speaking 600 mg of calcium (either carbonate or citrate) and 800 units of Vitamin D per day seems prudent. Certain women may benefit from higher intake of Vitamin D.  If you are among these women, your doctor will have told you during your visit.    PAP SMEARS:  Pap smears, to check for cervical cancer or precancers,  have traditionally been done yearly, although recent scientific advances have shown that most women can have pap smears less often.  However, every woman still should have a physical exam from her gynecologist every year. It will include a breast check, inspection of the vulva and vagina to check for abnormal growths or skin changes, a visual exam of the cervix, and then an exam to evaluate the size and shape of the uterus and  ovaries.  And after 34 years of age, a rectal exam is indicated to check for rectal cancers. We will also provide age appropriate advice regarding health maintenance, like when you should have certain vaccines, screening for sexually transmitted diseases, bone density testing, colonoscopy, mammograms, etc.   MAMMOGRAMS:  All women over 40 years old should have a yearly mammogram. Many facilities now offer a "3D" mammogram, which may cost around $50 extra out of pocket. If possible,  we recommend you accept the option to have the 3D mammogram performed.  It both reduces the number of women who will be called back for extra views which then turn out to be normal, and it is better than the routine mammogram at detecting truly abnormal areas.    COLONOSCOPY:  Colonoscopy to screen for colon cancer is recommended for all women at age 50.  We know, you hate the idea of the prep.  We agree, BUT, having colon cancer and not knowing it is worse!!  Colon cancer so often starts as a polyp that can be seen and removed at colonscopy, which can quite literally save your life!  And if your first colonoscopy is normal and you have no family history of colon cancer, most women don't have to have it again for 10 years.  Once every ten years, you can do something that may end up saving your life, right?  We will be happy to help you get it scheduled when you are ready.    Be sure to check your insurance coverage so you understand how much it will cost.  It may be covered as a preventative service at no cost, but you should check your particular policy.     HPV (Human Papillomavirus) Vaccine: What You Need to Know 1. Why get vaccinated? HPV vaccine prevents infection with human papillomavirus (HPV) types that are associated with many cancers, including:  cervical cancer in females,  vaginal and vulvar cancers in females,  anal cancer in females and males,  throat cancer in females and males, and  penile cancer in  males.  In addition, HPV vaccine prevents infection with HPV types that cause genital warts in both females and males. In the [place] about 12,000 women get cervical cancer every year, and about 4,000 women die from it. HPV vaccine can prevent most of these cases of cervical cancer. Vaccination is not a substitute for cervical cancer screening. This vaccine does not protect against all HPV types that can cause cervical cancer. Women should still get regular Pap tests. HPV infection usually comes from sexual contact, and most people will become infected at some point in their life. About 14 million Americans, including teens, get infected every year. Most infections will go away on their own and not cause serious problems. But thousands of women and men get cancer and other diseases from HPV. 2. HPV vaccine HPV vaccine is approved by FDA and is recommended by CDC for both males and females. It is routinely given at 11 or 34 years of age, but it may be given beginning at age 9 years through age 26 years. Most adolescents 9 through 34 years of age should get HPV vaccine as a two-dose series with the doses separated by 6-12 months. People who start HPV vaccination at 15 years of age and older should get the vaccine as a three-dose series with the second dose given 1-2 months after the first dose and the third dose given 6 months after the first dose. There are several exceptions to these age recommendations. Your health care provider can give you more information. 3. Some people should not get this vaccine  Anyone who has had a severe (life-threatening) allergic reaction to a dose of HPV vaccine should not get another dose.  Anyone who has a severe (life threatening) allergy to any component of HPV vaccine should not get the vaccine.  Tell your doctor if you have any severe allergies that you know of, including a severe allergy to yeast.  HPV vaccine is not recommended for pregnant women. If you learn  that you were pregnant when you were vaccinated, there is no reason to expect any problems for you or your baby. Any woman who learns she was pregnant when she got HPV vaccine is encouraged to contact the manufacturer's registry for HPV vaccination during pregnancy at 1-800-986-8999. Women who are breastfeeding may be vaccinated.  If you have a mild illness, such as a cold, you can probably get the vaccine today. If you are moderately or severely ill, you should probably wait until you recover. Your doctor can advise you. 4. Risks of a vaccine reaction With any medicine, including vaccines, there is a chance of side effects. These are usually mild and go away on their own, but serious reactions are also possible. Most people who get HPV vaccine do not have any serious problems with it. Mild or moderate problems following HPV vaccine:  Reactions in the arm where the shot was given: ? Soreness (about   9 people in 10) ? Redness or swelling (about 1 person in 3)  Fever: ? Mild (100F) (about 1 person in 10) ? Moderate (102F) (about 1 person in 65)  Other problems: ? Headache (about 1 person in 3) Problems that could happen after any injected vaccine:  People sometimes faint after a medical procedure, including vaccination. Sitting or lying down for about 15 minutes can help prevent fainting, and injuries caused by a fall. Tell your doctor if you feel dizzy, or have vision changes or ringing in the ears.  Some people get severe pain in the shoulder and have difficulty moving the arm where a shot was given. This happens very rarely.  Any medication can cause a severe allergic reaction. Such reactions from a vaccine are very rare, estimated at about 1 in a million doses, and would happen within a few minutes to a few hours after the vaccination. As with any medicine, there is a very remote chance of a vaccine causing a serious injury or death. The safety of vaccines is always being monitored. For  more information, visit: www.cdc.gov/vaccinesafety/. 5. What if there is a serious reaction? What should I look for? Look for anything that concerns you, such as signs of a severe allergic reaction, very high fever, or unusual behavior. Signs of a severe allergic reaction can include hives, swelling of the face and throat, difficulty breathing, a fast heartbeat, dizziness, and weakness. These would usually start a few minutes to a few hours after the vaccination. What should I do? If you think it is a severe allergic reaction or other emergency that can't wait, call 9-1-1 or get to the nearest hospital. Otherwise, call your doctor. Afterward, the reaction should be reported to the Vaccine Adverse Event Reporting System (VAERS). Your doctor should file this report, or you can do it yourself through the VAERS web site at www.vaers.hhs.gov, or by calling 1-800-822-7967. VAERS does not give medical advice. 6. The National Vaccine Injury Compensation Program The National Vaccine Injury Compensation Program (VICP) is a federal program that was created to compensate people who may have been injured by certain vaccines. Persons who believe they may have been injured by a vaccine can learn about the program and about filing a claim by calling 1-800-338-2382 or visiting the VICP website at www.hrsa.gov/vaccinecompensation. There is a time limit to file a claim for compensation. 7. How can I learn more?  Ask your health care provider. He or she can give you the vaccine package insert or suggest other sources of information.  Call your local or state health department.  Contact the Centers for Disease Control and Prevention (CDC): ? Call 1-800-232-4636 (1-800-CDC-INFO) or ? Visit CDC's website at www.cdc.gov/hpv Vaccine Information Statement, HPV Vaccine (11/02/2015) This information is not intended to replace advice given to you by your health care provider. Make sure you discuss any questions you have  with your health care provider. Document Released: 06/14/2014 Document Revised: 08/07/2016 Document Reviewed: 08/07/2016 Elsevier Interactive Patient Education  2017 Elsevier Inc.  

## 2018-02-10 ENCOUNTER — Telehealth: Payer: Self-pay

## 2018-02-10 NOTE — Telephone Encounter (Signed)
Patient returned call

## 2018-02-10 NOTE — Telephone Encounter (Signed)
Left message to call [place] at [phone].  Need to discuss appointment with GI for evaluation. Are there days and times she prefers? Has seen Dr.Brodie in the past.

## 2018-02-10 NOTE — Telephone Encounter (Signed)
Spoke with patient. Patient states that she sees a new GI MD in [place] Dr. Waylan Rocher and Dr Fermin Schwab. Advised will contact [facility] and return call with appointment date and time.  Left message Dr.Josephson's nurse Erline Levine to return call for assistance with scheduling.

## 2018-02-11 LAB — CYTOLOGY - PAP: HPV (WINDOPATH): NOT DETECTED

## 2018-02-11 NOTE — Telephone Encounter (Signed)
Spoke with Erline Levine at [place]. Appointment scheduled for March 25th at 3 pm with Dr.Josephson. This is first available as Dr.Josephson is out of town. Per Erline Levine it is okay for the patient not to be on her menses for examination. Is aware this is occurring with her menses. Patient is due for her menses that week or the week after. Patient is unsure. Patient is agreeable to date and time of appointment.  Routing to provider for final review. Patient agreeable to disposition. Will close encounter.

## 2018-02-25 ENCOUNTER — Ambulatory Visit (INDEPENDENT_AMBULATORY_CARE_PROVIDER_SITE_OTHER): Payer: BLUE CROSS/BLUE SHIELD

## 2018-02-25 ENCOUNTER — Ambulatory Visit (INDEPENDENT_AMBULATORY_CARE_PROVIDER_SITE_OTHER): Payer: BLUE CROSS/BLUE SHIELD | Admitting: Obstetrics and Gynecology

## 2018-02-25 ENCOUNTER — Encounter: Payer: Self-pay | Admitting: Obstetrics and Gynecology

## 2018-02-25 VITALS — BP 116/60 | HR 80 | Resp 16 | Ht 65.5 in | Wt 144.0 lb

## 2018-02-25 DIAGNOSIS — N926 Irregular menstruation, unspecified: Secondary | ICD-10-CM | POA: Diagnosis not present

## 2018-02-25 DIAGNOSIS — Z30431 Encounter for routine checking of intrauterine contraceptive device: Secondary | ICD-10-CM | POA: Diagnosis not present

## 2018-02-25 NOTE — Progress Notes (Signed)
Encounter reviewed by Dr. Brook Amundson C. Silva.  

## 2018-02-25 NOTE — Progress Notes (Signed)
GYNECOLOGY  VISIT   HPI: 32 y.o.   Married  Caucasian  female   G0P0000 with Patient's last menstrual period was 02/01/2018 (within days).   here for ultrasound.  Old blood with menses lasting up to two weeks.  Occurred 2 - 3 times this year.   Really pleased with the Mirena IUD.  Also having rectal bleeding with menses.  Also having increased constipation like she had before she were diagnosed with endometriosis. Saw her GI 3 days ago.  Has colonoscopy in April.  GYNECOLOGIC HISTORY: Patient's last menstrual period was 02/01/2018 (within days). Contraception:  Mirena IUD inserted 08/04/14 Menopausal hormone therapy:  none Last mammogram:  n/a Last pap smear:   12-03-16 Neg:Neg HR HPV        OB History    Gravida  0   Para  0   Term  0   Preterm  0   AB  0   Living  0     SAB  0   TAB  0   Ectopic  0   Multiple  0   Live Births                 Patient Active Problem List   Diagnosis Date Noted  . Genetic testing 01/01/2017  . Family history of ovarian cancer   . Family history of colon cancer     Past Medical History:  Diagnosis Date  . 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 Anxiety   . Colonic dysmotility    small bowel  . Depression    and anxiety  . Elevated bilirubin 12/25/2016  . Family history of colon cancer   . Family history of ovarian cancer   . Genetic testing 01/01/2017   Result: No pathogenic mutations detected  Genes Tested: 43 genes on [employer]'s Common Cancers panel (APC, ATM, AXIN2, BARD1, BMPR1A, BRCA1, BRCA2, BRIP1, CDH1, CDKN2A, CHEK2, DICER1, EPCAM, GREM1, HOXB13, KIT, MEN1, MLH1, MSH2, MSH6, MUTYH, NBN, NF1, PALB2, PDGFRA, PMS2, POLD1, POLE, PTEN, RAD50, RAD51C, RAD51D, SDHA, SDHB, SDHC, SDHD, SMAD4, SMARCA4, STK11, TP53, TSC1, TSC2, VHL).  . Gilbert syndrome 2018  . Hiatal hernia   . Kidney stones    rt with lithotripsy    Past Surgical History:  Procedure Laterality Date  . COLPOSCOPY     CIN II  . LAPAROSCOPIC  OVARIAN CYSTECTOMY Left 01/24/2014   Procedure: LAPAROSCOPIC OVARIAN LEFT CYSTECTOMY; BIOPSY UTERINE SEROSA;  Surgeon: Brook E Amundson de Carvalho E Silva, MD;  Location: [facility];  Service: Gynecology;  Laterality: Left;  . LYSIS OF ADHESION N/A 01/24/2014   Procedure: LYSIS OF ADHESION, fulgeration of endometriosis ;  Surgeon: Brook E Amundson de Carvalho E Silva, MD;  Location: [facility];  Service: Gynecology;  Laterality: N/A;  . TONSILLECTOMY      Current Outpatient Medications  Medication Sig Dispense Refill  . escitalopram (LEXAPRO) 20 MG tablet Take 1 capsule by mouth daily.    . esomeprazole (NEXIUM) 40 MG capsule Take 1 capsule by mouth daily.  1  . levonorgestrel (MIRENA) 20 MCG/24HR IUD 1 each by Intrauterine route once.    . pramoxine-hydrocortisone (PROCTOCREAM-HC) 1-1 % rectal cream Place rectally as needed.     No current facility-administered medications for this visit.      ALLERGIES: Patient has no known allergies.  Family History  Problem Relation Age of Onset  . Ovarian cancer Maternal Grandmother 70  . Colon cancer Maternal Grandmother 70         deceased 75  . Heart disease Paternal Grandfather   . Lung cancer Father 60       smoker; currently 62  . Ovarian cancer Maternal Aunt        Dx 50s; deceased 50s    Social History   Socioeconomic History  . Marital status: Married    Spouse name: Not on file  . Number of children: 0  . Years of education: Not on file  . Highest education level: Not on file  Occupational History  . Occupation: photographer/ recruiter  Social Needs  . Financial resource strain: Not on file  . Food insecurity:    Worry: Not on file    Inability: Not on file  . Transportation needs:    Medical: Not on file    Non-medical: Not on file  Tobacco Use  . Smoking status: Never Smoker  . Smokeless tobacco: Never Used  Substance and Sexual Activity  . Alcohol use: Yes    Alcohol/week: 1.2 oz    Types: 2 Standard drinks or equivalent  per week    Comment: 1-2 drinks a week (alcohol, wine or beer)  . Drug use: No  . Sexual activity: Yes    Partners: Male    Birth control/protection: IUD    Comment: Mirena inserted 08-14-14  Lifestyle  . Physical activity:    Days per week: Not on file    Minutes per session: Not on file  . Stress: Not on file  Relationships  . Social connections:    Talks on phone: Not on file    Gets together: Not on file    Attends religious service: Not on file    Active member of club or organization: Not on file    Attends meetings of clubs or organizations: Not on file    Relationship status: Not on file  . Intimate partner violence:    Fear of current or ex partner: Not on file    Emotionally abused: Not on file    Physically abused: Not on file    Forced sexual activity: Not on file  Other Topics Concern  . Not on file  Social History Narrative  . Not on file    ROS:  Pertinent items are noted in HPI.  PHYSICAL EXAMINATION:    BP 116/60 (BP Location: Right Arm, Patient Position: Sitting, Cuff Size: Normal)   Pulse 80   Resp 16   Ht 5' 5.5" (1.664 m)   Wt 144 lb (65.3 kg)   LMP 02/01/2018 (Within Days)   BMI 23.60 kg/m     General appearance: alert, cooperative and appears stated age Head: Normocephalic, without obvious abnormality, atraumatic  Pelvic US Uterus with no masses.  IUD in endometrial canal. Right ovary with small follicle and free fluid near ovary.  Left ovary with small hemorrhagic CL.    ASSESSMENT  Irregular menses with Mirena IUD.  Rectal bleeding with menses. Hx endometriosis.  FH ovarian cancer.  PLAN  Discussed US findings and reassurance given.  She will complete her colonoscopy and work on regimen for constipation with her GI. FU for annual exam and prn.   An After Visit Summary was printed and given to the patient.  ___15___ minutes face to face time of which over 50% was spent in counseling.   

## 2019-05-31 ENCOUNTER — Other Ambulatory Visit: Payer: Self-pay

## 2019-06-02 ENCOUNTER — Encounter: Payer: Self-pay | Admitting: Obstetrics and Gynecology

## 2019-06-02 ENCOUNTER — Other Ambulatory Visit (HOSPITAL_COMMUNITY)
Admission: RE | Admit: 2019-06-02 | Discharge: 2019-06-02 | Disposition: A | Discharge status: Home / Self Care | Payer: 59 | Source: Ambulatory Visit | Attending: Obstetrics and Gynecology | Admitting: Obstetrics and Gynecology

## 2019-06-02 ENCOUNTER — Other Ambulatory Visit: Payer: Self-pay

## 2019-06-02 ENCOUNTER — Ambulatory Visit: Payer: 59 | Admitting: Obstetrics and Gynecology

## 2019-06-02 VITALS — BP 100/60 | HR 80 | Temp 98.1°F | Resp 12 | Ht 65.25 in | Wt 139.0 lb

## 2019-06-02 DIAGNOSIS — Z01419 Encounter for gynecological examination (general) (routine) without abnormal findings: Secondary | ICD-10-CM | POA: Insufficient documentation

## 2019-06-02 DIAGNOSIS — Z3009 Encounter for other general counseling and advice on contraception: Secondary | ICD-10-CM

## 2019-06-02 MED ORDER — MISOPROSTOL 200 MCG PO TABS: ORAL_TABLET | ORAL | 0 refills | Status: DC

## 2019-06-02 MED ORDER — ESCITALOPRAM OXALATE 20 MG PO TABS: 20.0000 mg | ORAL_TABLET | Freq: Every day | ORAL | 3 refills | Status: DC

## 2019-06-02 NOTE — Patient Instructions (Signed)

## 2019-06-02 NOTE — Progress Notes (Signed)
33 y.o. G19P0000 Married Caucasian female here for annual exam.    Having more of black blood with her cycles for the last 4 months.  This is occurring every 2 weeks.  Prior to this she was skipping.  She wants a new Mirena IUD.   Using Lexapro for years for depression and anxiety.  This interferes with her sleep. Taking it since about age 6.  Tried to stop and she did not feel well.  Never hospitalized for this.  No suicidal ideation.   Working during the pandemic.   She currently does not have a PCP.   PCP: Fermin Schwab, MD    No LMP recorded (lmp unknown). (Menstrual status: IUD).           Sexually active: Yes.    The current method of family planning is Mirena IUD inserted 08/14/14.    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 Colonoscopy:  2015 prior to endometriosis surgery TDaP:  2016 Gardasil:   Completed.  HIV and Hep C: negative Screening Labs:  PCP   reports that she has never smoked. She has never used smokeless tobacco. She reports current alcohol use of about 2.0 standard drinks of alcohol per week. She reports that she does not use drugs.  Past Medical History:  Diagnosis Date  . 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 Anxiety   . Colonic dysmotility    small bowel  . Depression    and anxiety  . Elevated bilirubin 12/25/2016  . Family history of colon cancer   . Family history of ovarian cancer   . Genetic testing 01/01/2017   Result: No pathogenic mutations detected  Genes Tested: 43 genes on [employer] Common Cancers panel (APC, ATM, AXIN2, BARD1, BMPR1A, BRCA1, BRCA2, BRIP1, CDH1, CDKN2A, CHEK2, DICER1, EPCAM, GREM1, HOXB13, KIT, MEN1, MLH1, MSH2, MSH6, MUTYH, NBN, NF1, PALB2, PDGFRA, PMS2, POLD1, POLE, PTEN, RAD50, RAD51C, RAD51D, SDHA, SDHB, SDHC, SDHD,  SMAD4, SMARCA4, STK11, TP53, TSC1, TSC2, VHL).  Rosanna Randy syndrome 2018  . Hiatal hernia   . Kidney stones    rt with lithotripsy    Past Surgical History:  Procedure Laterality Date  . COLPOSCOPY     CIN II  . LAPAROSCOPIC OVARIAN CYSTECTOMY Left 01/24/2014   Procedure: LAPAROSCOPIC OVARIAN LEFT CYSTECTOMY; BIOPSY UTERINE SEROSA;  Surgeon: Jamey Reas de Berton Lan, MD;  Location: [facility];  Service: Gynecology;  Laterality: Left;  . LYSIS OF ADHESION N/A 01/24/2014   Procedure: LYSIS OF ADHESION, fulgeration of endometriosis ;  Surgeon: Jamey Reas de Berton Lan, MD;  Location: [facility];  Service: Gynecology;  Laterality: N/A;  . SHOULDER ARTHROSCOPY WITH ROTATOR CUFF REPAIR AND OPEN BICEPS TENODESIS    . TONSILLECTOMY      Current Outpatient Medications  Medication Sig Dispense Refill  . escitalopram (LEXAPRO) 20 MG tablet Take 1 capsule by mouth daily.    Marland Kitchen esomeprazole (NEXIUM) 40 MG capsule Take 1 capsule by mouth daily.  1  . levonorgestrel (MIRENA) 20 MCG/24HR IUD 1 each by Intrauterine route once.     No current facility-administered medications for this visit.     Family History  Problem Relation Age of Onset  . Ovarian cancer Maternal Grandmother 7  . Colon cancer Maternal Grandmother  65       deceased 68  . Heart disease Paternal Grandfather   . Lung cancer Father 45       smoker; currently 50  . Ovarian cancer Maternal Aunt        Dx 31s; deceased 77s    Review of Systems  Constitutional: Negative.   HENT: Negative.   Eyes: Negative.   Respiratory: Negative.   Cardiovascular: Negative.   Gastrointestinal: Negative.   Endocrine: Negative.   Genitourinary: Negative.   Musculoskeletal: Negative.   Skin: Negative.   Allergic/Immunologic: Negative.   Neurological: Negative.   Hematological: Negative.   Psychiatric/Behavioral: Negative.     Exam:   BP 100/60 (BP Location: Left Arm, Patient Position: Sitting, Cuff Size: Normal)   Pulse  80   Temp 98.1 F (36.7 C) (Temporal)   Resp 12   Ht 5' 5.25" (1.657 m)   Wt 139 lb (63 kg)   LMP  (LMP Unknown)   BMI 22.95 kg/m     General appearance: alert, cooperative and appears stated age Head: normocephalic, without obvious abnormality, atraumatic Neck: no adenopathy, supple, symmetrical, trachea midline and thyroid normal to inspection and palpation Lungs: clear to auscultation bilaterally Breasts: normal appearance, no masses or tenderness, No nipple retraction or dimpling, No nipple discharge or bleeding, No axillary adenopathy Heart: regular rate and rhythm Abdomen: soft, non-tender; no masses, no organomegaly Extremities: extremities normal, atraumatic, no cyanosis or edema Skin: skin color, texture, turgor normal. No rashes or lesions Lymph nodes: cervical, supraclavicular, and axillary nodes normal. Neurologic: grossly normal  Pelvic: External genitalia:  no lesions              No abnormal inguinal nodes palpated.              Urethra:  normal appearing urethra with no masses, tenderness or lesions              Bartholins and Skenes: normal                 Vagina: normal appearing vagina with normal color and discharge, no lesions              Cervix: no lesions. IUD strings noted.               Pap taken: Yes.   Bimanual Exam:  Uterus:  normal size, contour, position, consistency, mobility, non-tender              Adnexa: no mass, fullness, tenderness              Chaperone was present for exam.  Assessment:   Well woman visit with normal exam. Hx CIN II.  Mirena IUD patient.  Hx endometriosis.  FH of ovarian and colon cancer. Negative genetic testing for patient.  Rectal bleeding with menses.  Resolved.  Gilbert's.  Anxiety.   Plan: Mammogram screening discussed. Self breast awareness reviewed. Pap and HR HPV as above. Guidelines for Calcium, Vitamin D, regular exercise program including cardiovascular and weight bearing exercise. Lexapro 20 mg  daily.  #90, RF 3.  Routine labs.  She will return for IUD exchange.  Rx for Cytotec.  Follow up annually and prn.   After visit summary provided.

## 2019-06-03 LAB — CBC
Hematocrit: 36.6 % (ref 34.0–46.6)
Hemoglobin: 12.6 g/dL (ref 11.1–15.9)
MCH: 32.1 pg (ref 26.6–33.0)
MCHC: 34.4 g/dL (ref 31.5–35.7)
MCV: 93 fL (ref 79–97)
Platelets: 209 10*3/uL (ref 150–450)
RBC: 3.93 x10E6/uL (ref 3.77–5.28)
RDW: 11 % — ABNORMAL LOW (ref 11.7–15.4)
WBC: 6 10*3/uL (ref 3.4–10.8)

## 2019-06-03 LAB — COMPREHENSIVE METABOLIC PANEL
ALT: 18 IU/L (ref 0–32)
AST: 18 IU/L (ref 0–40)
Albumin/Globulin Ratio: 2 (ref 1.2–2.2)
Albumin: 4.4 g/dL (ref 3.8–4.8)
Alkaline Phosphatase: 51 IU/L (ref 39–117)
BUN/Creatinine Ratio: 14 (ref 9–23)
BUN: 11 mg/dL (ref 6–20)
Bilirubin Total: 1.8 mg/dL — ABNORMAL HIGH (ref 0.0–1.2)
CO2: 26 mmol/L (ref 20–29)
Calcium: 9.3 mg/dL (ref 8.7–10.2)
Chloride: 101 mmol/L (ref 96–106)
Creatinine, Ser: 0.77 mg/dL (ref 0.57–1.00)
GFR calc Af Amer: 117 mL/min/{1.73_m2} (ref >59)
GFR calc non Af Amer: 101 mL/min/{1.73_m2} (ref >59)
Globulin, Total: 2.2 g/dL (ref 1.5–4.5)
Glucose: 71 mg/dL (ref 65–99)
Potassium: 4.9 mmol/L (ref 3.5–5.2)
Sodium: 137 mmol/L (ref 134–144)
Total Protein: 6.6 g/dL (ref 6.0–8.5)

## 2019-06-03 LAB — LIPID PANEL
Chol/HDL Ratio: 2.3 ratio (ref 0.0–4.4)
Cholesterol, Total: 136 mg/dL (ref 100–199)
HDL: 59 mg/dL (ref >39)
LDL Calculated: 62 mg/dL (ref 0–99)
Triglycerides: 73 mg/dL (ref 0–149)
VLDL Cholesterol Cal: 15 mg/dL (ref 5–40)

## 2019-06-03 LAB — TSH: TSH: 1.56 u[IU]/mL (ref 0.450–4.500)

## 2019-06-03 LAB — VITAMIN D 25 HYDROXY (VIT D DEFICIENCY, FRACTURES): Vit D, 25-Hydroxy: 30.9 ng/mL (ref 30.0–100.0)

## 2019-06-07 LAB — CYTOLOGY - PAP
Diagnosis: NEGATIVE
HPV: NOT DETECTED

## 2019-06-08 ENCOUNTER — Telehealth: Payer: Self-pay | Admitting: Obstetrics and Gynecology

## 2019-06-08 NOTE — Telephone Encounter (Signed)
°  Spoke with patient and conveyed benefits for - Patient understands/agreeable with the benefits. Patient is aware of the cancellation policy. Patient states she will be out of town and will call in September to have this done.

## 2019-08-11 ENCOUNTER — Telehealth: Payer: Self-pay | Admitting: Obstetrics and Gynecology

## 2019-08-11 NOTE — Telephone Encounter (Signed)
Spoke with patient, requesting to schedule Mirena IUD exchange. Per review of last AEX 06/02/19, exchange was discussed. Procedure scheduled for 09/05/19 at 10:30am with Dr. Quincy Simmonds. Orders previously placed for precert. Patient declined earlier appts offered. Patient verbalzies understanding and is agreeable.   Routing to provider for final review. Patient is agreeable to disposition. Will close encounter.   Cc: Lerry Liner, Magdalene Patricia

## 2019-08-11 NOTE — Telephone Encounter (Signed)
Patient is ready to have have her IUD replaced.

## 2019-09-01 ENCOUNTER — Other Ambulatory Visit: Payer: Self-pay

## 2019-09-01 NOTE — Progress Notes (Signed)
GYNECOLOGY  VISIT   HPI: 33 y.o.   Married  Caucasian  female   G0P0000 with No LMP recorded. (Menstrual status: IUD).   here for Mirena IUD exchange.    Really happy with her Mirena IUD.   Took ibuprofen 600 mg.  Took Cytotec also.   UPT:Neg  GYNECOLOGIC HISTORY: No LMP recorded. (Menstrual status: IUD). Contraception:  Mirena IUD 08-14-14 Menopausal hormone therapy:  n/a Last mammogram:  n/a Last pap smear: 06-02-19 Neg:Neg HR HPV, 02/09/2018 Neg, neg HR HPV, 12-03-16 Neg:Neg HR HPV, 08-09-15 Neg:Neg HR HPV        OB History    Gravida  0   Para  0   Term  0   Preterm  0   AB  0   Living  0     SAB  0   TAB  0   Ectopic  0   Multiple  0   Live Births                 Patient Active Problem List   Diagnosis Date Noted  . Genetic testing 01/01/2017  . Family history of ovarian cancer   . Family history of colon cancer     Past Medical History:  Diagnosis Date  . 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 Anxiety   . Colonic dysmotility    small bowel  . Depression    and anxiety  . Elevated bilirubin 12/25/2016  . Family history of colon cancer   . Family history of ovarian cancer   . Genetic testing 01/01/2017   Result: No pathogenic mutations detected  Genes Tested: 43 genes on [employer]'s Common Cancers panel (APC, ATM, AXIN2, BARD1, BMPR1A, BRCA1, BRCA2, BRIP1, CDH1, CDKN2A, CHEK2, DICER1, EPCAM, GREM1, HOXB13, KIT, MEN1, MLH1, MSH2, MSH6, MUTYH, NBN, NF1, PALB2, PDGFRA, PMS2, POLD1, POLE, PTEN, RAD50, RAD51C, RAD51D, SDHA, SDHB, SDHC, SDHD, SMAD4, SMARCA4, STK11, TP53, TSC1, TSC2, VHL).  Rosanna Randy syndrome 2018  . Hiatal hernia   . Kidney stones    rt with lithotripsy    Past Surgical History:  Procedure Laterality Date  . COLPOSCOPY     CIN II  . LAPAROSCOPIC OVARIAN CYSTECTOMY Left 01/24/2014   Procedure: LAPAROSCOPIC OVARIAN LEFT CYSTECTOMY; BIOPSY UTERINE SEROSA;  Surgeon: Jamey Reas de Berton Lan, MD;  Location: [facility];   Service: Gynecology;  Laterality: Left;  . LYSIS OF ADHESION N/A 01/24/2014   Procedure: LYSIS OF ADHESION, fulgeration of endometriosis ;  Surgeon: Jamey Reas de Berton Lan, MD;  Location: [facility];  Service: Gynecology;  Laterality: N/A;  . SHOULDER ARTHROSCOPY WITH ROTATOR CUFF REPAIR AND OPEN BICEPS TENODESIS    . TONSILLECTOMY      Current Outpatient Medications  Medication Sig Dispense Refill  . escitalopram (LEXAPRO) 20 MG tablet Take 1 tablet (20 mg total) by mouth daily. 90 tablet 3  . esomeprazole (NEXIUM) 40 MG capsule Take 1 capsule by mouth daily.  1  . levonorgestrel (MIRENA) 20 MCG/24HR IUD 1 each by Intrauterine route once.    . misoprostol (CYTOTEC) 200 MCG tablet Place one tablet (200 mcg) in the vagina the evening prior to IUD insertion and then the am of the IUD insertion. 2 tablet 0   No current facility-administered medications for this visit.      ALLERGIES: Patient has no known allergies.  Family History  Problem Relation Age of Onset  . Ovarian cancer Maternal Grandmother 37  . Colon cancer  Maternal Grandmother 70       deceased 33  . Heart disease Paternal Grandfather   . Lung cancer Father 74       smoker; currently 43  . Ovarian cancer Maternal Aunt        Dx 61s; deceased 37s    Social History   Socioeconomic History  . Marital status: Married    Spouse name: Not on file  . Number of children: 0  . Years of education: Not on file  . Highest education level: Not on file  Occupational History  . Occupation: Tour manager  Social Needs  . Financial resource strain: Not on file  . Food insecurity    Worry: Not on file    Inability: Not on file  . Transportation needs    Medical: Not on file    Non-medical: Not on file  Tobacco Use  . Smoking status: Never Smoker  . Smokeless tobacco: Never Used  Substance and Sexual Activity  . Alcohol use: Yes    Alcohol/week: 2.0 standard drinks    Types: 2 Standard drinks or  equivalent per week    Comment: 1-2 drinks a week (alcohol, wine or beer)  . Drug use: No  . Sexual activity: Yes    Partners: Male    Birth control/protection: I.U.D.    Comment: Mirena inserted 08-14-14  Lifestyle  . Physical activity    Days per week: Not on file    Minutes per session: Not on file  . Stress: Not on file  Relationships  . Social Herbalist on phone: Not on file    Gets together: Not on file    Attends religious service: Not on file    Active member of club or organization: Not on file    Attends meetings of clubs or organizations: Not on file    Relationship status: Not on file  . Intimate partner violence    Fear of current or ex partner: Not on file    Emotionally abused: Not on file    Physically abused: Not on file    Forced sexual activity: Not on file  Other Topics Concern  . Not on file  Social History Narrative  . Not on file    Review of Systems  All other systems reviewed and are negative.   PHYSICAL EXAMINATION:    BP 100/60   Pulse 64   Temp 98 F (36.7 C)   Ht 5' 5.25" (1.657 m)   Wt 140 lb 9.6 oz (63.8 kg)   BMI 23.22 kg/m     General appearance: alert, cooperative and appears stated age  Pelvic: External genitalia:  no lesions              Urethra:  normal appearing urethra with no masses, tenderness or lesions              Bartholins and Skenes: normal                 Vagina: normal appearing vagina with normal color and discharge, no lesions              Cervix: no lesions                Bimanual Exam:  Uterus:  normal size, contour, position, consistency, mobility, non-tender              Adnexa: no mass, fullness, tenderness  Consent for removal of IUD and placement of new Mirena.  Sterile prep with Hibiclens.  Paracervical block - 10 cc 1% lidocaine - lot [id number], expiration April 2022. Ring forceps used to attempt IUD removal, one string broke off IUD and IUD remained in uterus.    Chaperone  was present for exam.  ASSESSMENT  Retained IUD.  Hx endometriosis.   PLAN  Will return for pelvic ultrasound for surgical planning for IUD removal. She declines pelvic US in radiology facility today.   An After Visit Summary was printed and given to the patient.  __15____ minutes face to face time of which over 50% was spent in counseling.

## 2019-09-05 ENCOUNTER — Other Ambulatory Visit: Payer: Self-pay

## 2019-09-05 ENCOUNTER — Ambulatory Visit (INDEPENDENT_AMBULATORY_CARE_PROVIDER_SITE_OTHER): Payer: 59 | Admitting: Obstetrics and Gynecology

## 2019-09-05 ENCOUNTER — Encounter: Payer: Self-pay | Admitting: Obstetrics and Gynecology

## 2019-09-05 ENCOUNTER — Telehealth: Payer: Self-pay | Admitting: Obstetrics and Gynecology

## 2019-09-05 VITALS — BP 100/60 | HR 64 | Temp 98.0°F | Ht 65.25 in | Wt 140.6 lb

## 2019-09-05 DIAGNOSIS — Z30432 Encounter for removal of intrauterine contraceptive device: Secondary | ICD-10-CM

## 2019-09-05 DIAGNOSIS — Z3009 Encounter for other general counseling and advice on contraception: Secondary | ICD-10-CM | POA: Diagnosis not present

## 2019-09-05 DIAGNOSIS — Z01812 Encounter for preprocedural laboratory examination: Secondary | ICD-10-CM | POA: Diagnosis not present

## 2019-09-05 DIAGNOSIS — Z30431 Encounter for routine checking of intrauterine contraceptive device: Secondary | ICD-10-CM

## 2019-09-05 LAB — POCT URINE PREGNANCY: Preg Test, Ur: NEGATIVE

## 2019-09-05 NOTE — Telephone Encounter (Signed)
Call placed to patient to review benefit for scheduled ultrasound. Left voicemail message requesting a return call

## 2019-09-05 NOTE — Telephone Encounter (Signed)
Patient left voicemail over lunch returning call to [facility].

## 2019-09-06 ENCOUNTER — Other Ambulatory Visit: Payer: 59

## 2019-09-06 ENCOUNTER — Telehealth: Payer: Self-pay | Admitting: Obstetrics and Gynecology

## 2019-09-06 ENCOUNTER — Other Ambulatory Visit: Payer: 59 | Admitting: Obstetrics and Gynecology

## 2019-09-06 NOTE — Telephone Encounter (Signed)
Patient's mother called to cancel ultrasound scheduled for this afternoon. States Beth Huerta has an emergency with her dog and is taking him to the emergency clinic. Would like to reschedule to Thursday 10/8 if possible.

## 2019-09-06 NOTE — Telephone Encounter (Signed)
Call to patient. Patient's PUS appointment rescheduled to 09-08-2019 at 1000 with 1030 consult with Dr. Quincy Simmonds. Patient agreeable to date and time of appointment. Patient appreciative of phone call.   Routing to provider and will close encounter.

## 2019-09-07 NOTE — Telephone Encounter (Signed)
Returned call to patient. Reviewed benefit for scheduled ultrasound appointment on 09/08/2019 with Dr Quincy Simmonds. Patient acknowledges understanding of information presented. Patient is aware of the appointment date, arrival time and cancellation policy. No further questions. Will close encounter

## 2019-09-08 ENCOUNTER — Other Ambulatory Visit: Payer: Self-pay

## 2019-09-08 ENCOUNTER — Ambulatory Visit (INDEPENDENT_AMBULATORY_CARE_PROVIDER_SITE_OTHER): Payer: 59

## 2019-09-08 ENCOUNTER — Ambulatory Visit: Payer: 59 | Admitting: Obstetrics and Gynecology

## 2019-09-08 ENCOUNTER — Other Ambulatory Visit: Payer: Self-pay | Admitting: *Deleted

## 2019-09-08 ENCOUNTER — Encounter: Payer: Self-pay | Admitting: Obstetrics and Gynecology

## 2019-09-08 ENCOUNTER — Telehealth: Payer: Self-pay | Admitting: Obstetrics and Gynecology

## 2019-09-08 VITALS — BP 120/78 | HR 60 | Temp 98.1°F | Ht 65.25 in | Wt 139.4 lb

## 2019-09-08 DIAGNOSIS — Z30431 Encounter for routine checking of intrauterine contraceptive device: Secondary | ICD-10-CM | POA: Diagnosis not present

## 2019-09-08 DIAGNOSIS — T8339XA Other mechanical complication of intrauterine contraceptive device, initial encounter: Secondary | ICD-10-CM | POA: Diagnosis not present

## 2019-09-08 DIAGNOSIS — N882 Stricture and stenosis of cervix uteri: Secondary | ICD-10-CM

## 2019-09-08 MED ORDER — MIRENA (52 MG) 20 MCG/24HR IU IUD: 1.0000 | INTRAUTERINE_SYSTEM | Freq: Once | INTRAUTERINE | 0 refills | Status: AC

## 2019-09-08 NOTE — Progress Notes (Signed)
GYNECOLOGY  VISIT   HPI: 33 y.o.   Married  Caucasian  female   G0P0000 with No LMP recorded. (Menstrual status: IUD).   here for pelvic ultrasound for IUD check up.   I attempted to remove her IUD this week for her IUD exchange, and one of the strings ruptured from the IUD.  Patient has a hx of endometriosis.   State she had terrible cramping with use of Cytotec which she took in preparation for the attempted IUD exchange.  GYNECOLOGIC HISTORY: No LMP recorded. (Menstrual status: IUD). Contraception:  Mirena IUD 08-14-14 Menopausal hormone therapy:  n/a Last mammogram:  n/a Last pap smear:  06-02-19 Neg:Neg HR HPV,02/09/2018 Neg, neg HR HPV, 12-03-16 Neg:Neg HR HPV        OB History    Gravida  0   Para  0   Term  0   Preterm  0   AB  0   Living  0     SAB  0   TAB  0   Ectopic  0   Multiple  0   Live Births                 Patient Active Problem List   Diagnosis Date Noted  . Genetic testing 01/01/2017  . Family history of ovarian cancer   . Family history of colon cancer     Past Medical History:  Diagnosis Date  . 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 Anxiety   . Colonic dysmotility    small bowel  . Depression    and anxiety  . Elevated bilirubin 12/25/2016  . Family history of colon cancer   . Family history of ovarian cancer   . Genetic testing 01/01/2017   Result: No pathogenic mutations detected  Genes Tested: 43 genes on [employer] Common Cancers panel (APC, ATM, AXIN2, BARD1, BMPR1A, BRCA1, BRCA2, BRIP1, CDH1, CDKN2A, CHEK2, DICER1, EPCAM, GREM1, HOXB13, KIT, MEN1, MLH1, MSH2, MSH6, MUTYH, NBN, NF1, PALB2, PDGFRA, PMS2, POLD1, POLE, PTEN, RAD50, RAD51C, RAD51D, SDHA, SDHB, SDHC, SDHD, SMAD4, SMARCA4, STK11, TP53, TSC1, TSC2, VHL).  Rosanna Randy syndrome 2018  . Hiatal hernia   . Kidney stones    rt with lithotripsy    Past Surgical History:  Procedure Laterality Date  . COLPOSCOPY     CIN II  . LAPAROSCOPIC OVARIAN CYSTECTOMY Left  01/24/2014   Procedure: LAPAROSCOPIC OVARIAN LEFT CYSTECTOMY; BIOPSY UTERINE SEROSA;  Surgeon: Jamey Reas de Berton Lan, MD;  Location: [facility];  Service: Gynecology;  Laterality: Left;  . LYSIS OF ADHESION N/A 01/24/2014   Procedure: LYSIS OF ADHESION, fulgeration of endometriosis ;  Surgeon: Jamey Reas de Berton Lan, MD;  Location: [facility];  Service: Gynecology;  Laterality: N/A;  . SHOULDER ARTHROSCOPY WITH ROTATOR CUFF REPAIR AND OPEN BICEPS TENODESIS    . TONSILLECTOMY      Current Outpatient Medications  Medication Sig Dispense Refill  . escitalopram (LEXAPRO) 20 MG tablet Take 1 tablet (20 mg total) by mouth daily. 90 tablet 3  . esomeprazole (NEXIUM) 40 MG capsule Take 1 capsule by mouth daily.  1  . levonorgestrel (MIRENA) 20 MCG/24HR IUD 1 each by Intrauterine route once.    . misoprostol (CYTOTEC) 200 MCG tablet Place one tablet (200 mcg) in the vagina the evening prior to IUD insertion and then the am of the IUD insertion. 2 tablet 0   No current facility-administered medications for this visit.  ALLERGIES: Patient has no known allergies.  Family History  Problem Relation Age of Onset  . Ovarian cancer Maternal Grandmother 75  . Colon cancer Maternal Grandmother 26       deceased 50  . Heart disease Paternal Grandfather   . Lung cancer Father 88       smoker; currently 51  . Ovarian cancer Maternal Aunt        Dx 76s; deceased 59s    Social History   Socioeconomic History  . Marital status: Married    Spouse name: Not on file  . Number of children: 0  . Years of education: Not on file  . Highest education level: Not on file  Occupational History  . Occupation: Tour manager  Social Needs  . Financial resource strain: Not on file  . Food insecurity    Worry: Not on file    Inability: Not on file  . Transportation needs    Medical: Not on file    Non-medical: Not on file  Tobacco Use  . Smoking status: Never Smoker  .  Smokeless tobacco: Never Used  Substance and Sexual Activity  . Alcohol use: Yes    Alcohol/week: 2.0 standard drinks    Types: 2 Standard drinks or equivalent per week    Comment: 1-2 drinks a week (alcohol, wine or beer)  . Drug use: No  . Sexual activity: Yes    Partners: Male    Birth control/protection: I.U.D.    Comment: Mirena inserted 08-14-14  Lifestyle  . Physical activity    Days per week: Not on file    Minutes per session: Not on file  . Stress: Not on file  Relationships  . Social Herbalist on phone: Not on file    Gets together: Not on file    Attends religious service: Not on file    Active member of club or organization: Not on file    Attends meetings of clubs or organizations: Not on file    Relationship status: Not on file  . Intimate partner violence    Fear of current or ex partner: Not on file    Emotionally abused: Not on file    Physically abused: Not on file    Forced sexual activity: Not on file  Other Topics Concern  . Not on file  Social History Narrative  . Not on file    Review of Systems  All other systems reviewed and are negative.   PHYSICAL EXAMINATION:    BP 120/78   Pulse 60   Temp 98.1 F (36.7 C) (Temporal)   Ht 5' 5.25" (1.657 m)   Wt 139 lb 6.4 oz (63.2 kg)   BMI 23.02 kg/m     General appearance: alert, cooperative and appears stated age Head: Normocephalic, without obvious abnormality, atraumatic Lungs: clear to auscultation bilaterally Heart: regular rate and rhythm Abdomen: soft, non-tender, no masses,  no organomegaly Extremities: extremities normal, atraumatic, no cyanosis or edema Skin: Skin color, texture, turgor normal. No rashes or lesions No abnormal inguinal nodes palpated Neurologic: Grossly normal  Pelvic:Deferred today.  Just done this week.  Pelvic US Uterus no myometrial masses.  EMS 2.72 mm. IUD in normal position in endometrial canal.  Ovaries normal.  Right CL cyst. No free fluid.    ASSESSMENT  Retained Mirena IUD.  Cervical stenosis.  Hx endometriosis.   PLAN  We discussed options for office removal of IUD with cervical dilation versus outpatient hysteroscopic removal  of IUD and hopefully placement of new Mirena IUD. She prefers surgical removal  Risks, benefits, and alternatives reviewed. Risks include but are not limited to bleeding, infection, damage to surrounding organs including uterine perforation requiring hospitalization and laparoscopy, pulmonary edema, reaction to anesthesia, DVT, PE, death, need for further treatment and surgery. Surgical expectations and recovery discussed.  Patient wishes to proceed.   An After Visit Summary was printed and given to the patient.  __25____ minutes face to face time of which over 50% was spent in counseling.

## 2019-09-08 NOTE — Telephone Encounter (Signed)
Call to patient regarding surgery date options.  Patient desires 09-26-19. Patient aware IUD has been ordered through [employer]. Will wait for confirmation of delivery.

## 2019-09-08 NOTE — Telephone Encounter (Signed)
Call placed to patient to review benefit for procedure and IUD exchange in a hospital setting. Patient acknowledges understanding of information presented. Patient does desire to proceed with scheduling, but will call office on 09/09/2019 to confirm   cc: Lamont Snowball, RN        Thayer Ohm

## 2019-09-12 ENCOUNTER — Telehealth: Payer: Self-pay | Admitting: Obstetrics and Gynecology

## 2019-09-12 NOTE — Telephone Encounter (Signed)
Patient called to cancel tentative surgery date for 10/26. 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

## 2019-09-12 NOTE — Telephone Encounter (Signed)
See new encounter. Patient requests to delay currently planned surgery date.

## 2019-09-21 NOTE — Telephone Encounter (Signed)
Routing to business office to follow-up on insurance issue.

## 2019-10-31 ENCOUNTER — Telehealth: Payer: Self-pay | Admitting: Obstetrics and Gynecology

## 2019-10-31 NOTE — Telephone Encounter (Signed)
Patient want to discuss iud removal.

## 2019-10-31 NOTE — Telephone Encounter (Signed)
Spoke with patient. Patient is requesting to proceed with surgical removal of Mirena IUD discussed at [place] on 09/08/19. Denies any GYN concerns. See previous account notes.  Patient is requesting to proceed with scheduling before end of year.   Advised patient I will forward to business office for review of benefits and to Lamont Snowball, RN for return call to discuss surgery scheduling availability. Patient agreeable.   Routing [place]  Cc: Lamont Snowball, RN, Dr. Quincy Simmonds

## 2019-11-01 NOTE — Telephone Encounter (Signed)
Spoke with patient in regards to re-verified benefits for previously discussed surgery plan. Patient acknowledges understanding of information presented. Patient understands pre-surgery requirements, and desires to move forward with scheduling,  but wants to verify surgery date of 11/29/2019 definitely an option and available before confirming surgery  Forwarding to Lamont Snowball, RN

## 2019-11-01 NOTE — Telephone Encounter (Signed)
Thank you for the update.   Cc- Beth Huerta

## 2019-11-08 ENCOUNTER — Telehealth: Payer: Self-pay | Admitting: *Deleted

## 2019-11-08 NOTE — Telephone Encounter (Signed)
Call to patient. Surgery information form reviewed in detail with patient and she verbalized understanding. Patient advised would receive a copy at pre op appointment. Surgery consult scheduled for 11-14-2019 at 1100. Patient agreeable to date and time of appointment. Patient advised hospital is now working on getting IUD for scheduled surgery. Patient agreeable. Patient aware will be contacted by [facility] for pre op to be scheduled at their facility. Post op appointment tentatively scheduled for 11-30-2019 at 1130, but aware may need to be rescheduled to 4 weeks out if IUD able to be placed. Patient agreeable. Patient advised covid pre surgical screening scheduled for 11-18-2019 at 1435 and will need to quarantine after testing until surgery. Patient agreeable. 8 things to do during your quarantine reviewed with patient and she verbalized understanding.   Routing to provider and will close encounter.   Cc Lamont Snowball, RN

## 2019-11-08 NOTE — Telephone Encounter (Signed)
Call to patient. Surgery date options discussed. Patient agreeable to 11-22-19.Will schedule and call patient back.

## 2019-11-09 NOTE — Telephone Encounter (Signed)
See next phone encounter for surgery confirmation. Encounter closed.

## 2019-11-10 ENCOUNTER — Other Ambulatory Visit: Payer: Self-pay

## 2019-11-10 NOTE — Progress Notes (Signed)
GYNECOLOGY  VISIT   HPI: 33 y.o.   Married  Caucasian  female   G0P0000 with No LMP recorded. (Menstrual status: IUD).   here for surgical consult.    Needs to have her Mirena IUD removed surgically.  We tried to remove the IUD in the office on 09/05/19, and a string broke off.  She has cervical stenosis.  Pelvic US documented her IUD to be in a normal position on 09/08/19.   Would like a new Mirena IUD.   She has an appointment for Covid testing 11/18/19.   Father has Covid.   GYNECOLOGIC HISTORY: No LMP recorded. (Menstrual status: IUD). Contraception: Mirena IUD 08-14-14 Menopausal hormone therapy:  n/a Last mammogram:  n/a Last pap smear:06-02-19 Neg:Neg HR HPV,02/09/2018 Neg, neg HR HPV, 12-03-16 Neg:Neg HR HPV         OB History    Gravida  0   Para  0   Term  0   Preterm  0   AB  0   Living  0     SAB  0   TAB  0   Ectopic  0   Multiple  0   Live Births                 Patient Active Problem List   Diagnosis Date Noted  . Genetic testing 01/01/2017  . Family history of ovarian cancer   . Family history of colon cancer     Past Medical History:  Diagnosis Date  . 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 Anxiety   . Colonic dysmotility    small bowel  . Depression    and anxiety  . Elevated bilirubin 12/25/2016  . Family history of colon cancer   . Family history of ovarian cancer   . Genetic testing 01/01/2017   Result: No pathogenic mutations detected  Genes Tested: 43 genes on [employer]'s Common Cancers panel (APC, ATM, AXIN2, BARD1, BMPR1A, BRCA1, BRCA2, BRIP1, CDH1, CDKN2A, CHEK2, DICER1, EPCAM, GREM1, HOXB13, KIT, MEN1, MLH1, MSH2, MSH6, MUTYH, NBN, NF1, PALB2, PDGFRA, PMS2, POLD1, POLE, PTEN, RAD50, RAD51C, RAD51D, SDHA, SDHB, SDHC, SDHD, SMAD4, SMARCA4, STK11, TP53, TSC1, TSC2, VHL).  Rosanna Randy syndrome 2018  . Hiatal hernia   . Kidney stones    rt with lithotripsy    Past Surgical History:  Procedure Laterality Date  .  COLPOSCOPY     CIN II  . LAPAROSCOPIC OVARIAN CYSTECTOMY Left 01/24/2014   Procedure: LAPAROSCOPIC OVARIAN LEFT CYSTECTOMY; BIOPSY UTERINE SEROSA;  Surgeon: Jamey Reas de Berton Lan, MD;  Location: [facility];  Service: Gynecology;  Laterality: Left;  . LYSIS OF ADHESION N/A 01/24/2014   Procedure: LYSIS OF ADHESION, fulgeration of endometriosis ;  Surgeon: Jamey Reas de Berton Lan, MD;  Location: [facility];  Service: Gynecology;  Laterality: N/A;  . SHOULDER ARTHROSCOPY WITH ROTATOR CUFF REPAIR AND OPEN BICEPS TENODESIS    . TONSILLECTOMY      Current Outpatient Medications  Medication Sig Dispense Refill  . escitalopram (LEXAPRO) 20 MG tablet Take 1 tablet (20 mg total) by mouth daily. 90 tablet 3  . esomeprazole (NEXIUM) 40 MG capsule Take 1 capsule by mouth daily.  1  . ibuprofen (ADVIL) 800 MG tablet Take 1 tablet (800 mg total) by mouth every 8 (eight) hours as needed. 30 tablet 0  . levonorgestrel (MIRENA) 20 MCG/24HR IUD 1 each by Intrauterine route once.    Marland Kitchen levonorgestrel (MIRENA, 52 MG,)  20 MCG/24HR IUD 1 Intra Uterine Device (1 each total) by Intrauterine route once for 1 dose. To be inserted by physician during surgery. 1 each 0  . misoprostol (CYTOTEC) 200 MCG tablet Place one tablet (200 mcg) in the vagina the evening prior to IUD insertion and then the am of the IUD insertion. 2 tablet 0   No current facility-administered medications for this visit.     ALLERGIES: Patient has no known allergies.  Family History  Problem Relation Age of Onset  . Ovarian cancer Maternal Grandmother 3  . Colon cancer Maternal Grandmother 42       deceased 11  . Heart disease Paternal Grandfather   . Lung cancer Father 54       smoker; currently 36  . Ovarian cancer Maternal Aunt        Dx 40s; deceased 41s    Social History   Socioeconomic History  . Marital status: Married    Spouse name: Not on file  . Number of children: 0  . Years of education: Not on file   . Highest education level: Not on file  Occupational History  . Occupation: Tour manager  Tobacco Use  . Smoking status: Never Smoker  . Smokeless tobacco: Never Used  Substance and Sexual Activity  . Alcohol use: Yes    Alcohol/week: 2.0 standard drinks    Types: 2 Standard drinks or equivalent per week    Comment: 1-2 drinks a week (alcohol, wine or beer)  . Drug use: No  . Sexual activity: Yes    Partners: Male    Birth control/protection: I.U.D.    Comment: Mirena inserted 08-14-14  Other Topics Concern  . Not on file  Social History Narrative  . Not on file   Social Determinants of Health   Financial Resource Strain:   . Difficulty of Paying Living Expenses: Not on file  Food Insecurity:   . Worried About Charity fundraiser in the Last Year: Not on file  . Ran Out of Food in the Last Year: Not on file  Transportation Needs:   . Lack of Transportation (Medical): Not on file  . Lack of Transportation (Non-Medical): Not on file  Physical Activity:   . Days of Exercise per Week: Not on file  . Minutes of Exercise per Session: Not on file  Stress:   . Feeling of Stress : Not on file  Social Connections:   . Frequency of Communication with Friends and Family: Not on file  . Frequency of Social Gatherings with Friends and Family: Not on file  . Attends Religious Services: Not on file  . Active Member of Clubs or Organizations: Not on file  . Attends Archivist Meetings: Not on file  . Marital Status: Not on file  Intimate Partner Violence:   . Fear of Current or Ex-Partner: Not on file  . Emotionally Abused: Not on file  . Physically Abused: Not on file  . Sexually Abused: Not on file    Review of Systems  All other systems reviewed and are negative.   PHYSICAL EXAMINATION:    BP 100/60   Pulse 79   Temp (!) 97.4 F (36.3 C)   Ht 5' 5"  (1.651 m)   Wt 144 lb 12.8 oz (65.7 kg)   SpO2 96%   BMI 24.10 kg/m     General appearance:  alert, cooperative and appears stated age Head: Normocephalic, without obvious abnormality, atraumatic Neck: no adenopathy, supple, symmetrical, trachea  midline and thyroid normal to inspection and palpation Lungs: clear to auscultation bilaterally Heart: regular rate and rhythm Abdomen: soft, non-tender, no masses,  no organomegaly Extremities: extremities normal, atraumatic, no cyanosis or edema Skin: Skin color, texture, turgor normal. No rashes or lesions No abnormal inguinal nodes palpated Neurologic: Grossly normal  Pelvic: External genitalia:  no lesions              Urethra:  normal appearing urethra with no masses, tenderness or lesions              Bartholins and Skenes: normal                 Vagina: normal appearing vagina with normal color and discharge, no lesions              Cervix: no lesions.  IUD strings not seen.  Bloody mucous noted.                 Bimanual Exam:  Uterus:  normal size, contour, position, consistency, mobility, non-tender              Adnexa: no mass, fullness, tenderness               Chaperone was present for exam.  ASSESSMENT  Retained IUD.  Lost IUD thread.   PLAN  Plan for pelvic US to confirm location of IUD.  Provided her [place] is in her uterus, will proceed with dilation of cervix, hysteroscopic removal of IUD, and potential placement of new Mirena IUD if the hospital is able to provide this to the OR for my placement.  Risks include but are not limited to bleeding, infection, damage to surrounding organs including uterine perforation requiring hospitalization and laparoscopy, pulmonary edema, reaction to anesthesia, DVT, PE, death, and placement of the new Mirena in the office setting is needed.   Surgical expectations and recovery discussed.  Patient wishes to proceed.  Plan for Cytotec 200 mcg pv the night prior to procedure and the am of the procedure. Rx for Motrin 800 mg every 8 hours prn.  She understands the interaction between  Lexapro and Motrin.   An After Visit Summary was printed and given to the patient.  __15____ minutes face to face time of which over 50% was spent in counseling.

## 2019-11-14 ENCOUNTER — Other Ambulatory Visit: Payer: Self-pay

## 2019-11-14 ENCOUNTER — Ambulatory Visit (HOSPITAL_COMMUNITY)
Admission: RE | Admit: 2019-11-14 | Discharge: 2019-11-14 | Disposition: A | Discharge status: Home / Self Care | Payer: 59 | Source: Ambulatory Visit | Attending: Obstetrics and Gynecology | Admitting: Obstetrics and Gynecology

## 2019-11-14 ENCOUNTER — Encounter: Payer: Self-pay | Admitting: Obstetrics and Gynecology

## 2019-11-14 ENCOUNTER — Ambulatory Visit: Payer: 59 | Admitting: Obstetrics and Gynecology

## 2019-11-14 VITALS — BP 100/60 | HR 79 | Temp 97.4°F | Ht 65.0 in | Wt 144.8 lb

## 2019-11-14 DIAGNOSIS — T8332XA Displacement of intrauterine contraceptive device, initial encounter: Secondary | ICD-10-CM | POA: Diagnosis not present

## 2019-11-14 DIAGNOSIS — N882 Stricture and stenosis of cervix uteri: Secondary | ICD-10-CM | POA: Diagnosis not present

## 2019-11-14 DIAGNOSIS — Z30431 Encounter for routine checking of intrauterine contraceptive device: Secondary | ICD-10-CM | POA: Diagnosis not present

## 2019-11-14 MED ORDER — MISOPROSTOL 200 MCG PO TABS: ORAL_TABLET | ORAL | 0 refills | Status: DC

## 2019-11-14 MED ORDER — IBUPROFEN 800 MG PO TABS: 800.0000 mg | ORAL_TABLET | Freq: Three times a day (TID) | ORAL | 0 refills | Status: DC | PRN

## 2019-11-14 NOTE — Progress Notes (Signed)
Patient scheduled while in office for STAT US pelvis complete w/ transvaginal, today at 3pm at [facility]. Patient to arrive at 2:45pm with full bladder, drink 32 oz of water 1 hr prior. Hold patient, call report to office. Patient verbalizes understanding and is agreeable.

## 2019-11-17 ENCOUNTER — Encounter (HOSPITAL_BASED_OUTPATIENT_CLINIC_OR_DEPARTMENT_OTHER): Payer: Self-pay | Admitting: Obstetrics and Gynecology

## 2019-11-17 ENCOUNTER — Telehealth: Payer: Self-pay | Admitting: *Deleted

## 2019-11-17 ENCOUNTER — Other Ambulatory Visit: Payer: Self-pay

## 2019-11-17 NOTE — Progress Notes (Signed)
Spoke w/ via phone for pre-op interview---[facility] needs dos----   Cbc urine preg           Lab results------ COVID test ------11-18-19 Arrive at -------900 am 11-22-19 NPO after ------midnight Medications to take morning of surgery -----nexium Diabetic medication ----n/a- Patient Special Instructions ----- Pre-Op special Istructions ----- Patient verbalized understanding of instructions that were given at this phone interview. Patient denies shortness of breath, chest pain, fever, cough a this phone interview.

## 2019-11-17 NOTE — Telephone Encounter (Signed)
Call to patient. Notified hospital has IUD available for insertion during surgery.  Routing to provider. Encounter closed.

## 2019-11-18 ENCOUNTER — Other Ambulatory Visit (HOSPITAL_COMMUNITY)
Admission: RE | Admit: 2019-11-18 | Discharge: 2019-11-18 | Disposition: A | Discharge status: Home / Self Care | Payer: 59 | Source: Ambulatory Visit | Attending: Obstetrics and Gynecology | Admitting: Obstetrics and Gynecology

## 2019-11-18 DIAGNOSIS — Z20828 Contact with and (suspected) exposure to other viral communicable diseases: Secondary | ICD-10-CM | POA: Insufficient documentation

## 2019-11-18 DIAGNOSIS — Z01812 Encounter for preprocedural laboratory examination: Secondary | ICD-10-CM | POA: Diagnosis not present

## 2019-11-19 LAB — NOVEL CORONAVIRUS, NAA (HOSP ORDER, SEND-OUT TO REF LAB; TAT 18-24 HRS): SARS-CoV-2, NAA: NOT DETECTED

## 2019-11-21 NOTE — H&P (Signed)
Office Visit  11/14/2019 [place] Silva, Everardo All, MD Obstetrics and Gynecology  Cervical stenosis (uterine cervix) +2 more Dx  office visit; Referred by Loanne Drilling, MD Reason for Visit  Additional Documentation  Vitals:    BP 100/60  Pulse 79  Temp 97.4 F (36.3 C)   Ht _0  (1.651 m)  Wt 65.7 kg  SpO2 96%  BMI 24.10 kg/m  BSA 1.74 m    More Vitals  Flowsheets:    Anthropometrics,  NEWS,  MEWS Score,  Method of Visit    Encounter Info:   Billing Info,  History,  Allergies,  Detailed Report    All Notes   Progress Notes by Burnice Logan, RN at 11/14/2019 11:00 AM Author: Burnice Logan, RN Author Type: Registered Nurse Filed: 11/14/2019  4:54 PM  Note Status: Signed Cosign: Cosign Not Required Encounter Date: 11/14/2019  Editor: Burnice Logan, RN (Registered Nurse)    Patient scheduled while in office for STAT US pelvis complete w/ transvaginal, today at 3pm at [facility]. Patient to arrive at 2:45pm with full bladder, drink 32 oz of water 1 hr prior. Hold patient, call report to office. Patient verbalizes understanding and is agreeable.      Progress Notes by Yates Decamp, CMA at 11/14/2019 11:00 AM Author: Yates Decamp, CMA Author Type: Certified Medical Assistant Filed: 11/14/2019  4:54 PM  Note Status: Signed Cosign: Cosign Not Required Encounter Date: 11/14/2019  Editor: Nunzio Cobbs, MD (Physician)  Prior Versions: 1. Yates Decamp, CMA (Certified Psychologist, sport and exercise) at 11/14/2019 11:18 AM - Sign when Signing Visit   2. Lowella Fairy, CMA (Certified Psychologist, sport and exercise) at 11/10/2019  1:55 PM - Sign when Signing Visit    GYNECOLOGY  VISIT   HPI: 33 y.o.   Married  Caucasian  female   [id number] with No LMP recorded. (Menstrual status: IUD).   here for surgical consult.     Needs to have her Mirena IUD removed surgically.  We tried to remove the IUD in the office on  09/05/19, and a string broke off.  She has cervical stenosis.  Pelvic US documented her IUD to be in a normal position on 09/08/19.    Would like a new Mirena IUD.    She has an appointment for Covid testing 11/18/19.    Father has Covid.    GYNECOLOGIC HISTORY: No LMP recorded. (Menstrual status: IUD). Contraception: Mirena IUD 08-14-14 Menopausal hormone therapy:  n/a Last mammogram:  n/a Last pap smear: 06-02-19 Neg:Neg HR HPV, 02/09/2018 Neg, neg HR HPV, 12-03-16 Neg:Neg HR HPV                 OB History     Gravida  0   Para  0   Term  0   Preterm  0   AB  0   Living  0      SAB  0   TAB  0   Ectopic  0   Multiple  0   Live Births                        Patient Active Problem List    Diagnosis Date Noted  . Genetic testing 01/01/2017  . Family history of ovarian cancer    . Family history of colon cancer            Past  Medical History:  Diagnosis Date  . 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 Anxiety    . Colonic dysmotility      small bowel  . Depression      and anxiety  . Elevated bilirubin 12/25/2016  . Family history of colon cancer    . Family history of ovarian cancer    . Genetic testing 01/01/2017    Result: No pathogenic mutations detected  Genes Tested: 43 genes on [employer] Common Cancers panel (APC, ATM, AXIN2, BARD1, BMPR1A, BRCA1, BRCA2, BRIP1, CDH1, CDKN2A, CHEK2, DICER1, EPCAM, GREM1, HOXB13, KIT, MEN1, MLH1, MSH2, MSH6, MUTYH, NBN, NF1, PALB2, PDGFRA, PMS2, POLD1, POLE, PTEN, RAD50, RAD51C, RAD51D, SDHA, SDHB, SDHC, SDHD, SMAD4, SMARCA4, STK11, TP53, TSC1, TSC2, VHL).  Rosanna Randy syndrome 2018  . Hiatal hernia    . Kidney stones      rt with lithotripsy           Past Surgical History:  Procedure Laterality Date  . COLPOSCOPY        CIN II  . LAPAROSCOPIC OVARIAN CYSTECTOMY Left 01/24/2014    Procedure: LAPAROSCOPIC OVARIAN LEFT CYSTECTOMY; BIOPSY UTERINE SEROSA;  Surgeon: Jamey Reas de Berton Lan, MD;   Location: [facility];  Service: Gynecology;  Laterality: Left;  . LYSIS OF ADHESION N/A 01/24/2014    Procedure: LYSIS OF ADHESION, fulgeration of endometriosis ;  Surgeon: Jamey Reas de Berton Lan, MD;  Location: [facility];  Service: Gynecology;  Laterality: N/A;  . SHOULDER ARTHROSCOPY WITH ROTATOR CUFF REPAIR AND OPEN BICEPS TENODESIS      . TONSILLECTOMY                Current Outpatient Medications  Medication Sig Dispense Refill  . escitalopram (LEXAPRO) 20 MG tablet Take 1 tablet (20 mg total) by mouth daily. 90 tablet 3  . esomeprazole (NEXIUM) 40 MG capsule Take 1 capsule by mouth daily.   1  . ibuprofen (ADVIL) 800 MG tablet Take 1 tablet (800 mg total) by mouth every 8 (eight) hours as needed. 30 tablet 0  . levonorgestrel (MIRENA) 20 MCG/24HR IUD 1 each by Intrauterine route once.      Marland Kitchen levonorgestrel (MIRENA, 52 MG,) 20 MCG/24HR IUD 1 Intra Uterine Device (1 each total) by Intrauterine route once for 1 dose. To be inserted by physician during surgery. 1 each 0  . misoprostol (CYTOTEC) 200 MCG tablet Place one tablet (200 mcg) in the vagina the evening prior to IUD insertion and then the am of the IUD insertion. 2 tablet 0    No current facility-administered medications for this visit.      ALLERGIES: Patient has no known allergies.        Family History  Problem Relation Age of Onset  . Ovarian cancer Maternal Grandmother 80  . Colon cancer Maternal Grandmother 52        deceased 71  . Heart disease Paternal Grandfather    . Lung cancer Father 71        smoker; currently 91  . Ovarian cancer Maternal Aunt          Dx 25s; deceased 57s      Social History         Socioeconomic History  . Marital status: Married      Spouse name: Not on file  . Number of children: 0  . Years of education: Not on file  . Highest education level: Not on file  Occupational History  . Occupation: Tour manager  Tobacco Use  . Smoking status: Never Smoker  .  Smokeless tobacco: Never Used  Substance and Sexual Activity  . Alcohol use: Yes      Alcohol/week: 2.0 standard drinks      Types: 2 Standard drinks or equivalent per week      Comment: 1-2 drinks a week (alcohol, wine or beer)  . Drug use: No  . Sexual activity: Yes      Partners: Male      Birth control/protection: I.U.D.      Comment: Mirena inserted 08-14-14  Other Topics Concern  . Not on file  Social History Narrative  . Not on file    Social Determinants of Health       Financial Resource Strain:   . Difficulty of Paying Living Expenses: Not on file  Food Insecurity:   . Worried About Charity fundraiser in the Last Year: Not on file  . Ran Out of Food in the Last Year: Not on file  Transportation Needs:   . Lack of Transportation (Medical): Not on file  . Lack of Transportation (Non-Medical): Not on file  Physical Activity:   . Days of Exercise per Week: Not on file  . Minutes of Exercise per Session: Not on file  Stress:   . Feeling of Stress : Not on file  Social Connections:   . Frequency of Communication with Friends and Family: Not on file  . Frequency of Social Gatherings with Friends and Family: Not on file  . Attends Religious Services: Not on file  . Active Member of Clubs or Organizations: Not on file  . Attends Archivist Meetings: Not on file  . Marital Status: Not on file  Intimate Partner Violence:   . Fear of Current or Ex-Partner: Not on file  . Emotionally Abused: Not on file  . Physically Abused: Not on file  . Sexually Abused: Not on file      Review of Systems  All other systems reviewed and are negative.     PHYSICAL EXAMINATION:     BP 100/60   Pulse 79   Temp (!) 97.4 F (36.3 C)   Ht _0  (1.651 m)   Wt 144 lb 12.8 oz (65.7 kg)   SpO2 96%   BMI 24.10 kg/m     General appearance: alert, cooperative and appears stated age Head: Normocephalic, without obvious abnormality, atraumatic Neck: no adenopathy, supple,  symmetrical, trachea midline and thyroid normal to inspection and palpation Lungs: clear to auscultation bilaterally Heart: regular rate and rhythm Abdomen: soft, non-tender, no masses,  no organomegaly Extremities: extremities normal, atraumatic, no cyanosis or edema Skin: Skin color, texture, turgor normal. No rashes or lesions No abnormal inguinal nodes palpated Neurologic: Grossly normal   Pelvic: External genitalia:  no lesions              Urethra:  normal appearing urethra with no masses, tenderness or lesions              Bartholins and Skenes: normal                 Vagina: normal appearing vagina with normal color and discharge, no lesions              Cervix: no lesions.  IUD strings not seen.  Bloody mucous noted.                 Bimanual Exam:  Uterus:  normal size, contour, position, consistency, mobility, non-tender              Adnexa: no mass, fullness, tenderness                Chaperone was present for exam.   ASSESSMENT   Retained IUD.  Lost IUD thread.    PLAN   Plan for pelvic US to confirm location of IUD.  Provided her [place] is in her uterus, will proceed with dilation of cervix, hysteroscopic removal of IUD, and potential placement of new Mirena IUD if the hospital is able to provide this to the OR for my placement.  Risks include but are not limited to bleeding, infection, damage to surrounding organs including uterine perforation requiring hospitalization and laparoscopy, pulmonary edema, reaction to anesthesia, DVT, PE, death, and placement of the new Mirena in the office setting is needed.   Surgical expectations and recovery discussed.  Patient wishes to proceed.   Plan for Cytotec 200 mcg pv the night prior to procedure and the am of the procedure. Rx for Motrin 800 mg every 8 hours prn.  She understands the interaction between Lexapro and Motrin.   An After Visit Summary was printed and given to the patient.   __15____ minutes face to face time of  which over 50% was spent in counseling.        Addendum Pelvic US on 11/14/19 showed IUD to be in endometrial canal, normal uterus, trace fluid in the cervix, normal ovaries and no pelvic free fluid.

## 2019-11-22 ENCOUNTER — Other Ambulatory Visit: Payer: Self-pay

## 2019-11-22 ENCOUNTER — Ambulatory Visit (HOSPITAL_BASED_OUTPATIENT_CLINIC_OR_DEPARTMENT_OTHER)
Admission: RE | Disposition: A | Discharge status: Home / Self Care | Payer: Self-pay | Source: Home / Self Care | Attending: Obstetrics and Gynecology

## 2019-11-22 ENCOUNTER — Ambulatory Visit (HOSPITAL_BASED_OUTPATIENT_CLINIC_OR_DEPARTMENT_OTHER)
Admission: RE | Admit: 2019-11-22 | Discharge: 2019-11-22 | Disposition: A | Discharge status: Home / Self Care | Payer: 59 | Attending: Obstetrics and Gynecology | Admitting: Obstetrics and Gynecology

## 2019-11-22 ENCOUNTER — Ambulatory Visit (HOSPITAL_BASED_OUTPATIENT_CLINIC_OR_DEPARTMENT_OTHER): Payer: 59 | Admitting: Anesthesiology

## 2019-11-22 ENCOUNTER — Encounter (HOSPITAL_BASED_OUTPATIENT_CLINIC_OR_DEPARTMENT_OTHER): Payer: Self-pay | Admitting: Obstetrics and Gynecology

## 2019-11-22 DIAGNOSIS — Z79899 Other long term (current) drug therapy: Secondary | ICD-10-CM | POA: Insufficient documentation

## 2019-11-22 DIAGNOSIS — Z30432 Encounter for removal of intrauterine contraceptive device: Secondary | ICD-10-CM | POA: Insufficient documentation

## 2019-11-22 DIAGNOSIS — N882 Stricture and stenosis of cervix uteri: Secondary | ICD-10-CM | POA: Diagnosis not present

## 2019-11-22 DIAGNOSIS — T8332XD Displacement of intrauterine contraceptive device, subsequent encounter: Secondary | ICD-10-CM | POA: Diagnosis not present

## 2019-11-22 DIAGNOSIS — F329 Major depressive disorder, single episode, unspecified: Secondary | ICD-10-CM | POA: Insufficient documentation

## 2019-11-22 DIAGNOSIS — Z30430 Encounter for insertion of intrauterine contraceptive device: Secondary | ICD-10-CM | POA: Diagnosis not present

## 2019-11-22 DIAGNOSIS — Z975 Presence of (intrauterine) contraceptive device: Secondary | ICD-10-CM | POA: Diagnosis not present

## 2019-11-22 DIAGNOSIS — F419 Anxiety disorder, unspecified: Secondary | ICD-10-CM | POA: Insufficient documentation

## 2019-11-22 HISTORY — PX: INTRAUTERINE DEVICE (IUD) INSERTION: SHX5877

## 2019-11-22 HISTORY — PX: HYSTEROSCOPY: SHX211

## 2019-11-22 HISTORY — DX: Personal history of urinary calculi: Z87.442

## 2019-11-22 HISTORY — PX: IUD REMOVAL: SHX5392

## 2019-11-22 LAB — CBC
HCT: 40.7 % (ref 36.0–46.0)
Hemoglobin: 13.1 g/dL (ref 12.0–15.0)
MCH: 32.3 pg (ref 26.0–34.0)
MCHC: 32.2 g/dL (ref 30.0–36.0)
MCV: 100.2 fL — ABNORMAL HIGH (ref 80.0–100.0)
Platelets: 176 10*3/uL (ref 150–400)
RBC: 4.06 MIL/uL (ref 3.87–5.11)
RDW: 11.8 % (ref 11.5–15.5)
WBC: 5.1 10*3/uL (ref 4.0–10.5)
nRBC: 0 % (ref 0.0–0.2)

## 2019-11-22 LAB — POCT PREGNANCY, URINE: Preg Test, Ur: NEGATIVE

## 2019-11-22 SURGERY — HYSTEROSCOPY
Anesthesia: General | Site: Vagina

## 2019-11-22 MED ORDER — LACTATED RINGERS IV SOLN
INTRAVENOUS | Status: DC
  Filled 2019-11-22: qty 1000

## 2019-11-22 MED ORDER — MIDAZOLAM HCL 5 MG/5ML IJ SOLN
INTRAMUSCULAR | Status: DC | PRN
  Administered 2019-11-22: 2 mg via INTRAVENOUS

## 2019-11-22 MED ORDER — NON FORMULARY
Status: DC | PRN
  Administered 2019-11-22: 52 mg via INTRAUTERINE

## 2019-11-22 MED ORDER — IBUPROFEN 800 MG PO TABS: 800.0000 mg | ORAL_TABLET | Freq: Three times a day (TID) | ORAL | 0 refills | Status: DC | PRN

## 2019-11-22 MED ORDER — ONDANSETRON HCL 4 MG/2ML IJ SOLN
INTRAMUSCULAR | Status: DC | PRN
  Administered 2019-11-22: 4 mg via INTRAVENOUS

## 2019-11-22 MED ORDER — FENTANYL CITRATE (PF) 100 MCG/2ML IJ SOLN
INTRAMUSCULAR | Status: DC | PRN
  Administered 2019-11-22: 100 ug via INTRAVENOUS
  Administered 2019-11-22 (×3): 50 ug via INTRAVENOUS

## 2019-11-22 MED ORDER — MEPERIDINE HCL 25 MG/ML IJ SOLN
INTRAMUSCULAR | Status: AC
  Filled 2019-11-22: qty 1

## 2019-11-22 MED ORDER — ACETAMINOPHEN 500 MG PO TABS
1000.0000 mg | ORAL_TABLET | Freq: Once | ORAL | Status: AC
  Administered 2019-11-22: 10:00:00 1000 mg via ORAL
  Filled 2019-11-22: qty 2

## 2019-11-22 MED ORDER — PROPOFOL 10 MG/ML IV BOLUS
INTRAVENOUS | Status: DC | PRN
  Administered 2019-11-22: 100 mg via INTRAVENOUS
  Administered 2019-11-22: 200 mg via INTRAVENOUS

## 2019-11-22 MED ORDER — OXYCODONE HCL 5 MG PO TABS
5.0000 mg | ORAL_TABLET | Freq: Once | ORAL | Status: DC | PRN
  Filled 2019-11-22: qty 1

## 2019-11-22 MED ORDER — PROPOFOL 10 MG/ML IV BOLUS
INTRAVENOUS | Status: AC
  Filled 2019-11-22: qty 20

## 2019-11-22 MED ORDER — SODIUM CHLORIDE 0.9 % IR SOLN
Status: DC | PRN
  Administered 2019-11-22: 1000 mL

## 2019-11-22 MED ORDER — ACETAMINOPHEN 500 MG PO TABS
ORAL_TABLET | ORAL | Status: AC
  Filled 2019-11-22: qty 2

## 2019-11-22 MED ORDER — KETOROLAC TROMETHAMINE 30 MG/ML IJ SOLN
INTRAMUSCULAR | Status: AC
  Filled 2019-11-22: qty 1

## 2019-11-22 MED ORDER — LIDOCAINE HCL 1 % IJ SOLN
INTRAMUSCULAR | Status: DC | PRN
  Administered 2019-11-22: 10 mL

## 2019-11-22 MED ORDER — LIDOCAINE 2% (20 MG/ML) 5 ML SYRINGE
INTRAMUSCULAR | Status: DC | PRN
  Administered 2019-11-22: 60 mg via INTRAVENOUS

## 2019-11-22 MED ORDER — KETOROLAC TROMETHAMINE 30 MG/ML IJ SOLN
30.0000 mg | Freq: Once | INTRAMUSCULAR | Status: DC | PRN
  Filled 2019-11-22: qty 1

## 2019-11-22 MED ORDER — OXYCODONE HCL 5 MG/5ML PO SOLN
5.0000 mg | Freq: Once | ORAL | Status: DC | PRN
  Filled 2019-11-22: qty 5

## 2019-11-22 MED ORDER — DEXAMETHASONE SODIUM PHOSPHATE 4 MG/ML IJ SOLN
INTRAMUSCULAR | Status: DC | PRN
  Administered 2019-11-22: 10 mg via INTRAVENOUS

## 2019-11-22 MED ORDER — KETOROLAC TROMETHAMINE 30 MG/ML IJ SOLN
INTRAMUSCULAR | Status: DC | PRN
  Administered 2019-11-22: 30 mg via INTRAVENOUS

## 2019-11-22 MED ORDER — PROMETHAZINE HCL 25 MG/ML IJ SOLN
6.2500 mg | INTRAMUSCULAR | Status: DC | PRN
  Filled 2019-11-22: qty 1

## 2019-11-22 MED ORDER — MEPERIDINE HCL 25 MG/ML IJ SOLN
6.2500 mg | INTRAMUSCULAR | Status: DC | PRN
  Administered 2019-11-22 (×2): 6.25 mg via INTRAVENOUS
  Filled 2019-11-22: qty 1

## 2019-11-22 MED ORDER — HYDROMORPHONE HCL 1 MG/ML IJ SOLN
0.2500 mg | INTRAMUSCULAR | Status: DC | PRN
  Filled 2019-11-22: qty 0.5

## 2019-11-22 SURGICAL SUPPLY — 21 items
BIPOLAR CUTTING LOOP 21FR (ELECTRODE)
CANISTER SUCT 3000ML PPV (MISCELLANEOUS) IMPLANT
CATH ROBINSON RED A/P 16FR (CATHETERS) ×2 IMPLANT
COVER MAYO STAND STRL (DRAPES) ×1 IMPLANT
COVER WAND RF STERILE (DRAPES) ×2 IMPLANT
DILATOR CANAL MILEX (MISCELLANEOUS) IMPLANT
GAUZE 4X4 16PLY RFD (DISPOSABLE) ×2 IMPLANT
GLOVE BIO SURGEON STRL SZ 6.5 (GLOVE) ×2 IMPLANT
GLOVE BIOGEL PI IND STRL 7.0 (GLOVE) ×2 IMPLANT
GLOVE BIOGEL PI INDICATOR 7.0 (GLOVE) ×2
GOWN STRL REUS W/ TWL LRG LVL3 (GOWN DISPOSABLE) ×2 IMPLANT
GOWN STRL REUS W/TWL LRG LVL3 (GOWN DISPOSABLE) ×2
KIT PROCEDURE FLUENT (KITS) ×2 IMPLANT
KIT TURNOVER CYSTO (KITS) ×2 IMPLANT
LOOP CUTTING BIPOLAR 21FR (ELECTRODE) ×1 IMPLANT
Mirena Levonorgestrel-releasing intrauterine syste ×1 IMPLANT
PACK VAGINAL MINOR WOMEN LF (CUSTOM PROCEDURE TRAY) ×2 IMPLANT
PAD OB MATERNITY 4.3X12.25 (PERSONAL CARE ITEMS) ×2 IMPLANT
PAD PREP 24X48 CUFFED NSTRL (MISCELLANEOUS) ×2 IMPLANT
TOWEL OR 17X26 10 PK STRL BLUE (TOWEL DISPOSABLE) ×3 IMPLANT
WATER STERILE IRR 500ML POUR (IV SOLUTION) ×1 IMPLANT

## 2019-11-22 NOTE — Discharge Instructions (Signed)
Intrauterine Device Insertion, Care After  This sheet gives you information about how to care for yourself after your procedure. Your health care provider may also give you more specific instructions. If you have problems or questions, contact your health care provider. What can I expect after the procedure? After the procedure, it is common to have:  Cramps and pain in the abdomen.  Light bleeding (spotting) or heavier bleeding that is like your menstrual period. This may last for up to a few days.  Lower back pain.  Dizziness.  Headaches.  Nausea. Follow these instructions at home:  Before resuming sexual activity, check to make sure that you can feel the IUD string(s). You should be able to feel the end of the string(s) below the opening of your cervix. If your IUD string is in place, you may resume sexual activity. ? If you had a hormonal IUD inserted more than 7 days after your most recent period started, you will need to use a backup method of birth control for 7 days after IUD insertion. Ask your health care provider whether this applies to you.  Continue to check that the IUD is still in place by feeling for the string(s) after every menstrual period, or once a month.  Take over-the-counter and prescription medicines only as told by your health care provider.  Do not drive or use heavy machinery while taking prescription pain medicine.  Keep all follow-up visits as told by your health care provider. This is important. Contact a health care provider if:  You have bleeding that is heavier or lasts longer than a normal menstrual cycle.  You have a fever.  You have cramps or abdominal pain that get worse or do not get better with medicine.  You develop abdominal pain that is new or is not in the same area of earlier cramping and pain.  You feel lightheaded or weak.  You have abnormal or bad-smelling discharge from your vagina.  You have pain during sexual  activity.  You have any of the following problems with your IUD string(s): ? The string bothers or hurts you or your sexual partner. ? You cannot feel the string. ? The string has gotten longer.  You can feel the IUD in your vagina.  You think you may be pregnant, or you miss your menstrual period.  You think you may have an STI (sexually transmitted infection). Get help right away if:  You have flu-like symptoms.  You have a fever and chills.  You can feel that your IUD has slipped out of place. Summary  After the procedure, it is common to have cramps and pain in the abdomen. It is also common to have light bleeding (spotting) or heavier bleeding that is like your menstrual period.  Continue to check that the IUD is still in place by feeling for the string(s) after every menstrual period, or once a month.  Keep all follow-up visits as told by your health care provider. This is important.  Contact your health care provider if you have problems with your IUD string(s), such as the string getting longer or bothering you or your sexual partner. This information is not intended to replace advice given to you by your health care provider. Make sure you discuss any questions you have with your health care provider. Document Released: 07/16/2011 Document Revised: 10/30/2017 Document Reviewed: 10/08/2016 Elsevier Patient Education  2020 [place]. Hysteroscopy, Care After This sheet gives you information about how to care for yourself  after your procedure. Your health care provider may also give you more specific instructions. If you have problems or questions, contact your health care provider. What can I expect after the procedure? After the procedure, it is common to have:  Cramping.  Bleeding. This can vary from light spotting to menstrual-like bleeding. Follow these instructions at home: Activity  Rest for 1-2 days after the procedure.  Do not douche, use tampons, or  have sex for 2 weeks after the procedure, or until your health care provider approves.  Do not drive for 24 hours after the procedure, or for as long as told by your health care provider.  Do not drive, use heavy machinery, or drink alcohol while taking prescription pain medicines. Medicines   Take over-the-counter and prescription medicines only as told by your health care provider.  Do not take aspirin during recovery. It can increase the risk of bleeding. General instructions  Do not take baths, swim, or use a hot tub until your health care provider approves. Take showers instead of baths for 2 weeks, or for as long as told by your health care provider.  To prevent or treat constipation while you are taking prescription pain medicine, your health care provider may recommend that you: ? Drink enough fluid to keep your urine clear or pale yellow. ? Take over-the-counter or prescription medicines. ? Eat foods that are high in fiber, such as fresh fruits and vegetables, whole grains, and beans. ? Limit foods that are high in fat and processed sugars, such as fried and sweet foods.  Keep all follow-up visits as told by your health care provider. This is important. Contact a health care provider if:  You feel dizzy or lightheaded.  You feel nauseous.  You have abnormal vaginal discharge.  You have a rash.  You have pain that does not get better with medicine.  You have chills. Get help right away if:  You have bleeding that is heavier than a normal menstrual period.  You have a fever.  You have pain or cramps that get worse.  You develop new abdominal pain.  You faint.  You have pain in your shoulders.  You have shortness of breath. Summary  After the procedure, you may have cramping and some vaginal bleeding.  Do not douche, use tampons, or have sex for 2 weeks after the procedure, or until your health care provider approves.  Do not take baths, swim, or use a  hot tub until your health care provider approves. Take showers instead of baths for 2 weeks, or for as long as told by your health care provider.  Report any unusual symptoms to your health care provider.  Keep all follow-up visits as told by your health care provider. This is important. This information is not intended to replace advice given to you by your health care provider. Make sure you discuss any questions you have with your health care provider. Document Released: 09/07/2013 Document Revised: 10/30/2017 Document Reviewed: 12/16/2016 Elsevier Patient Education  [place] Instructions  Activity: Get plenty of rest for the remainder of the day. A responsible individual must stay with you for 24 hours following the procedure.  For the next 24 hours, DO NOT: -Drive a car -Paediatric nurse -Drink alcoholic beverages -Take any medication unless instructed by your physician -Make any legal decisions or sign important papers.  Meals: Start with liquid foods such as gelatin or soup. Progress to regular foods  as tolerated. Avoid greasy, spicy, heavy foods. If nausea and/or vomiting occur, drink only clear liquids until the nausea and/or vomiting subsides. Call your physician if vomiting continues.  Special Instructions/Symptoms: Your throat may feel dry or sore from the anesthesia or the breathing tube placed in your throat during surgery. If this causes discomfort, gargle with warm salt water. The discomfort should disappear within 24 hours.

## 2019-11-22 NOTE — Anesthesia Preprocedure Evaluation (Addendum)
Anesthesia Evaluation  Patient identified by MRN, date of birth, ID band Patient awake    Reviewed: Allergy & Precautions, NPO status , Patient's Chart, lab work & pertinent test results  Airway Mallampati: I  TM Distance: >3 FB Neck ROM: Full    Dental no notable dental hx.    Pulmonary neg pulmonary ROS,    Pulmonary exam normal breath sounds clear to auscultation       Cardiovascular negative cardio ROS Normal cardiovascular exam Rhythm:Regular Rate:Normal     Neuro/Psych Anxiety negative neurological ROS     GI/Hepatic Neg liver ROS, hiatal hernia,   Endo/Other  negative endocrine ROS  Renal/GU negative Renal ROS     Musculoskeletal negative musculoskeletal ROS (+)   Abdominal   Peds  Hematology negative hematology ROS (+)   Anesthesia Other Findings Retained IUD, cervical stenosis  Reproductive/Obstetrics hcg negative                           Anesthesia Physical Anesthesia Plan  ASA: II  Anesthesia Plan: General   Post-op Pain Management:    Induction: Intravenous  PONV Risk Score and Plan: 4 or greater and Ondansetron, Dexamethasone, Midazolam and Treatment may vary due to age or medical condition  Airway Management Planned: LMA  Additional Equipment:   Intra-op Plan:   Post-operative Plan: Extubation in OR  Informed Consent: I have reviewed the patients History and Physical, chart, labs and discussed the procedure including the risks, benefits and alternatives for the proposed anesthesia with the patient or authorized representative who has indicated his/her understanding and acceptance.     Dental advisory given  Plan Discussed with: CRNA  Anesthesia Plan Comments:         Anesthesia Quick Evaluation

## 2019-11-22 NOTE — Progress Notes (Signed)
Update to History and Physical  No marked change in status since office pre-op visit.   Patient examined.   OK to proceed with surgery. 

## 2019-11-22 NOTE — Progress Notes (Deleted)
GYNECOLOGY  VISIT   HPI: 33 y.o.   Married  Caucasian  female   G0P0000 with Patient's last menstrual period was 11/15/2019.here for 1 week follow up HYSTEROSCOPY Hysteroscopic IUD removal and possible re-insertion (N/A )  INTRAUTERINE DEVICE (IUD) REMOVAL (N/A )  INTRAUTERINE DEVICE (IUD) INSERTION Possible re-insertion if patient can obtain IUD (N/A ).     GYNECOLOGIC HISTORY: Patient's last menstrual period was 11/15/2019. Contraception:  *** Menopausal hormone therapy:  n/a Last mammogram: n/a Last pap smear: 06-02-19 Neg:Neg HR HPV,02/09/2018 Neg, neg HRHPV,12-03-16 Neg:Neg HR HPV         OB History    Gravida  0   Para  0   Term  0   Preterm  0   AB  0   Living  0     SAB  0   TAB  0   Ectopic  0   Multiple  0   Live Births                 Patient Active Problem List   Diagnosis Date Noted  . Genetic testing 01/01/2017  . Family history of ovarian cancer   . Family history of colon cancer     Past Medical History:  Diagnosis Date  . 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 Anxiety   . Colonic dysmotility    small bowel  . Elevated bilirubin 12/25/2016  . Family history of colon cancer   . Family history of ovarian cancer   . Genetic testing 01/01/2017   Result: No pathogenic mutations detected  Genes Tested: 43 genes on [employer]'s Common Cancers panel (APC, ATM, AXIN2, BARD1, BMPR1A, BRCA1, BRCA2, BRIP1, CDH1, CDKN2A, CHEK2, DICER1, EPCAM, GREM1, HOXB13, KIT, MEN1, MLH1, MSH2, MSH6, MUTYH, NBN, NF1, PALB2, PDGFRA, PMS2, POLD1, POLE, PTEN, RAD50, RAD51C, RAD51D, SDHA, SDHB, SDHC, SDHD, SMAD4, SMARCA4, STK11, TP53, TSC1, TSC2, VHL).  Rosanna Randy syndrome 2018  . Hiatal hernia   . History of kidney stones   . Kidney stones    rt with lithotripsy    Past Surgical History:  Procedure Laterality Date  . COLPOSCOPY     CIN II  . LAPAROSCOPIC OVARIAN CYSTECTOMY Left 01/24/2014   Procedure: LAPAROSCOPIC OVARIAN LEFT CYSTECTOMY; BIOPSY UTERINE  SEROSA;  Surgeon: Jamey Reas de Berton Lan, MD;  Location: [facility];  Service: Gynecology;  Laterality: Left;  . LYSIS OF ADHESION N/A 01/24/2014   Procedure: LYSIS OF ADHESION, fulgeration of endometriosis ;  Surgeon: Jamey Reas de Berton Lan, MD;  Location: [facility];  Service: Gynecology;  Laterality: N/A;  . SHOULDER ARTHROSCOPY WITH ROTATOR CUFF REPAIR AND OPEN BICEPS TENODESIS Right   . TONSILLECTOMY      No current facility-administered medications for this visit.   No current outpatient medications on file.   Facility-Administered Medications Ordered in Other Visits  Medication Dose Route Frequency Provider Last Rate Last Admin  . lactated ringers infusion   Intravenous Continuous Nunzio Cobbs, MD      . lactated ringers infusion   Intravenous Continuous Annye Asa, MD         ALLERGIES: Patient has no known allergies.  Family History  Problem Relation Age of Onset  . Ovarian cancer Maternal Grandmother 66  . Colon cancer Maternal Grandmother 54       deceased 25  . Heart disease Paternal Grandfather   . Lung cancer Father 41       smoker; currently  35  . Ovarian cancer Maternal Aunt        Dx 34s; deceased 20s    Social History   Socioeconomic History  . Marital status: Married    Spouse name: Not on file  . Number of children: 0  . Years of education: Not on file  . Highest education level: Not on file  Occupational History  . Occupation: Tour manager  Tobacco Use  . Smoking status: Never Smoker  . Smokeless tobacco: Never Used  Substance and Sexual Activity  . Alcohol use: Yes    Alcohol/week: 2.0 standard drinks    Types: 2 Standard drinks or equivalent per week    Comment: 1-2 drinks a week (alcohol, wine or beer)  . Drug use: No  . Sexual activity: Yes    Partners: Male    Birth control/protection: I.U.D.    Comment: Mirena inserted 08-14-14  Other Topics Concern  . Not on file  Social History  Narrative  . Not on file   Social Determinants of Health   Financial Resource Strain:   . Difficulty of Paying Living Expenses: Not on file  Food Insecurity:   . Worried About Charity fundraiser in the Last Year: Not on file  . Ran Out of Food in the Last Year: Not on file  Transportation Needs:   . Lack of Transportation (Medical): Not on file  . Lack of Transportation (Non-Medical): Not on file  Physical Activity:   . Days of Exercise per Week: Not on file  . Minutes of Exercise per Session: Not on file  Stress:   . Feeling of Stress : Not on file  Social Connections:   . Frequency of Communication with Friends and Family: Not on file  . Frequency of Social Gatherings with Friends and Family: Not on file  . Attends Religious Services: Not on file  . Active Member of Clubs or Organizations: Not on file  . Attends Archivist Meetings: Not on file  . Marital Status: Not on file  Intimate Partner Violence:   . Fear of Current or Ex-Partner: Not on file  . Emotionally Abused: Not on file  . Physically Abused: Not on file  . Sexually Abused: Not on file    Review of Systems  PHYSICAL EXAMINATION:    LMP 11/15/2019 Comment: approximate. due to irregularity    General appearance: alert, cooperative and appears stated age Head: Normocephalic, without obvious abnormality, atraumatic Neck: no adenopathy, supple, symmetrical, trachea midline and thyroid normal to inspection and palpation Lungs: clear to auscultation bilaterally Breasts: normal appearance, no masses or tenderness, No nipple retraction or dimpling, No nipple discharge or bleeding, No axillary or supraclavicular adenopathy Heart: regular rate and rhythm Abdomen: soft, non-tender, no masses,  no organomegaly Extremities: extremities normal, atraumatic, no cyanosis or edema Skin: Skin color, texture, turgor normal. No rashes or lesions Lymph nodes: Cervical, supraclavicular, and axillary nodes normal. No  abnormal inguinal nodes palpated Neurologic: Grossly normal  Pelvic: External genitalia:  no lesions              Urethra:  normal appearing urethra with no masses, tenderness or lesions              Bartholins and Skenes: normal                 Vagina: normal appearing vagina with normal color and discharge, no lesions              Cervix: no  lesions                Bimanual Exam:  Uterus:  normal size, contour, position, consistency, mobility, non-tender              Adnexa: no mass, fullness, tenderness              Rectal exam: {yes no:[id number]}.  Confirms.              Anus:  normal sphincter tone, no lesions  Chaperone was present for exam.  ASSESSMENT     PLAN     An After Visit Summary was printed and given to the patient.  ______ minutes face to face time of which over 50% was spent in counseling.

## 2019-11-22 NOTE — Anesthesia Procedure Notes (Signed)
Procedure Name: LMA Insertion Date/Time: 11/22/2019 10:32 AM Performed by: Justice Rocher, CRNA Pre-anesthesia Checklist: Patient identified, Emergency Drugs available, Suction available and Patient being monitored Patient Re-evaluated:Patient Re-evaluated prior to induction Oxygen Delivery Method: Circle system utilized Preoxygenation: Pre-oxygenation with 100% oxygen Induction Type: IV induction Ventilation: Mask ventilation without difficulty LMA: LMA inserted LMA Size: 4.0 Number of attempts: 1 Airway Equipment and Method: Bite block Placement Confirmation: positive ETCO2 and breath sounds checked- equal and bilateral Tube secured with: Tape Dental Injury: Teeth and Oropharynx as per pre-operative assessment

## 2019-11-22 NOTE — Op Note (Signed)
OPERATIVE REPORT  PREOPERATIVE DIAGNOSES:   Expired Mirena IUD, lost IUD threads, cervical stenosis.  POSTOPERATIVE DIAGNOSES:   Expired Mirena IUD, lost IUD threads, cervical stenosis.  PROCEDURE:  Hysteroscopy with removal of Mirena IUD and insertion of new Mirena IUD.  SURGEON:  Lenard Galloway, MD  ANESTHESIA:  LMA, paracervical block with 10 mL of 1% lidocaine.  IV FLUIDS:   1000 ml LR  EBL:  2 cc  URINE OUTPUT:  300 ml by I and O catheter  NORMAL SALINE DEFICIT:   0000000 ml NS  COMPLICATIONS:  None.  INDICATIONS FOR THE PROCEDURE:     The patient is a 33 year old Caucasian female with a Mirena IUD placed on 08/14/14, who is due for removal of her IUD and requests insertion of a new Mirena IUD.  The removal was attempted in the office and one of the IUD strings ruptured.  She was noted to have cervical stenosis.  She had a pelvic US in follow up which showed the IUD to be in a normal position and showed normal uterine anatomy.  The patient requested removal of the IUD in the operating room setting.  At her preop visit, the remaining IUD string was not visible, and she had another pelvic US which confirmed the normal intrauterine IUD position again.   A plan is now made to proceed with a hysteroscopy with removal of the retained IUD and then placement of a new Mirena IUD, after risks, benefits and alternatives were reviewed.  FINDINGS:  Exam under anesthesia revealed a small anteverted uterus. The uterus was sounded to just over 7 cm after the hysteroscopy was performed.  Hysteroscopy showed the IUD strings high in the cervical canal.   After the IUD was removed, the uterine cavity was noted to be normal.  The left tubal ostia was visualized better than the right.  There was fluffy endometrium in the region of the right tubal ostia.   Her new Mirena IUD is lot number: [id number]. Expiration: January, 2023.   SPECIMENS:   None.   PROCEDURE IN DETAIL:  The patient was reidentified in  the preoperative hold area.  She received TED hose and PAS stockings for DVT prophylaxis.  In the operating room, the patient was placed in the dorsal lithotomy position and then an LMA anesthetic was introduced.  The patient's lower abdomen, vagina and perineum were sterilely prepped with Betadine and the  patient's bladder was catheterized of urine.  She was sterilly draped.  An exam under anesthesia was performed.  A speculum was placed inside the vagina and a single-tooth tenaculum was placed on the anterior cervical lip.  A paracervical block was performed with a total of 10 mL of 1% lidocaine plain.  The cervix was dilated with prat dilators. The operating hysteroscope was then inserted inside the uterine cavity under the continuous infusion of normal saline solution.  Findings are as noted above.  The grasper was inserted through the operating port of the hysteroscope.  The strings were grasped, and the IUD was removed intact and discarded.  Hysteroscopy was performed for assessment of the uterine cavity.  The findings are noted above.   The single-tooth tenaculum which had been placed on the anterior cervical lip was removed.  Hemostasis was good, and all of the vaginal instruments were removed.  The patient was awakened and escorted to the recovery room in stable condition after she was cleansed with Betadine.  There were no complications to the procedure.  All needle, instrument and sponge counts were correct.  Lenard Galloway, MD

## 2019-11-22 NOTE — Transfer of Care (Signed)
Immediate Anesthesia Transfer of Care Note  Patient: Beth Huerta  Procedure(s) Performed: Procedure(s) (LRB): HYSTEROSCOPY Hysteroscopic IUD removal and reinsertion (N/A) INTRAUTERINE DEVICE (IUD) REMOVAL (N/A) INTRAUTERINE DEVICE (IUD) INSERTION (N/A)  Patient Location: PACU  Anesthesia Type: General  Level of Consciousness: awake, sedated, patient cooperative and responds to stimulation  Airway & Oxygen Therapy: Patient Spontanous Breathing and Patient connected to [place] 02 and soft FM   Post-op Assessment: Report given to PACU RN, Post -op Vital signs reviewed and stable and Patient moving all extremities  Post vital signs: Reviewed and stable  Complications: No apparent anesthesia complications

## 2019-11-23 MED FILL — Levonorgestrel IUD 20 MCG/DAY (Initial) (52 MG Total): INTRAUTERINE | Qty: 1 | Status: AC

## 2019-11-23 NOTE — Anesthesia Postprocedure Evaluation (Signed)
Anesthesia Post Note  Patient: DECKER MIGGINS  Procedure(s) Performed: HYSTEROSCOPY Hysteroscopic IUD removal and reinsertion (N/A Vagina ) INTRAUTERINE DEVICE (IUD) REMOVAL (N/A Vagina ) INTRAUTERINE DEVICE (IUD) INSERTION (N/A Vagina )     Patient location during evaluation: PACU Anesthesia Type: General Level of consciousness: awake and alert Pain management: pain level controlled Vital Signs Assessment: post-procedure vital signs reviewed and stable Respiratory status: spontaneous breathing, nonlabored ventilation, respiratory function stable and patient connected to nasal cannula oxygen Cardiovascular status: blood pressure returned to baseline and stable Postop Assessment: no apparent nausea or vomiting Anesthetic complications: no    Last Vitals:  Vitals:   11/22/19 1230 11/22/19 1305  BP: 109/61 (!) 101/42  Pulse: 64 63  Resp: 13 14  Temp:  36.9 C  SpO2: 99% 98%    Last Pain:  Vitals:   11/23/19 1007  TempSrc:   PainSc: 3                  Jamarius Saha P Shevaun Lovan

## 2019-11-30 ENCOUNTER — Ambulatory Visit: Payer: 59 | Admitting: Obstetrics and Gynecology

## 2019-12-21 ENCOUNTER — Ambulatory Visit: Payer: 59 | Admitting: Obstetrics and Gynecology

## 2019-12-30 ENCOUNTER — Other Ambulatory Visit: Payer: Self-pay

## 2020-01-02 ENCOUNTER — Other Ambulatory Visit: Payer: Self-pay

## 2020-01-02 ENCOUNTER — Encounter: Payer: Self-pay | Admitting: Obstetrics and Gynecology

## 2020-01-02 ENCOUNTER — Ambulatory Visit (INDEPENDENT_AMBULATORY_CARE_PROVIDER_SITE_OTHER): Payer: 59 | Admitting: Obstetrics and Gynecology

## 2020-01-02 VITALS — BP 110/70 | HR 70 | Temp 97.3°F | Ht 65.0 in | Wt 147.0 lb

## 2020-01-02 DIAGNOSIS — B009 Herpesviral infection, unspecified: Secondary | ICD-10-CM | POA: Diagnosis not present

## 2020-01-02 DIAGNOSIS — Z30431 Encounter for routine checking of intrauterine contraceptive device: Secondary | ICD-10-CM

## 2020-01-02 NOTE — Progress Notes (Signed)
GYNECOLOGY  VISIT   HPI: 34 y.o.   Married  Caucasian  female   G0P0000 with No LMP recorded. (Menstrual status: IUD).   here for follow up HYSTEROSCOPY Hysteroscopic IUD removal and reinsertion (N/A Vagina ) INTRAUTERINE DEVICE (IUD) REMOVAL (N/A Vagina )  INTRAUTERINE DEVICE (IUD) INSERTION (N/A Vagina ).  Did well following surgery.  No problems with the IUD.   States she had a sore in the vagina that was really painful even to walk.  It started 6 days ago. It was enlarged and white in color. Now it is feeling better.  No drainage.  No fevers.   She has a hx of HSV I orally.  Occurs with stress.  No known lesions on her bottom.  GYNECOLOGIC HISTORY: No LMP recorded. (Menstrual status: IUD). Contraception:  Mirena IUD 11-22-19 Menopausal hormone therapy:  n/a Last mammogram:  n/a Last pap smear: 06-02-19 Neg:Neg HR HPV,02/09/2018 Neg, neg HRHPV,12-03-16 Neg:Neg HR HPV         OB History    Gravida  0   Para  0   Term  0   Preterm  0   AB  0   Living  0     SAB  0   TAB  0   Ectopic  0   Multiple  0   Live Births                 Patient Active Problem List   Diagnosis Date Noted  . Genetic testing 01/01/2017  . Family history of ovarian cancer   . Family history of colon cancer     Past Medical History:  Diagnosis Date  . 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 Anxiety   . Colonic dysmotility    small bowel  . Elevated bilirubin 12/25/2016  . Family history of colon cancer   . Family history of ovarian cancer   . Genetic testing 01/01/2017   Result: No pathogenic mutations detected  Genes Tested: 43 genes on [employer]'s Common Cancers panel (APC, ATM, AXIN2, BARD1, BMPR1A, BRCA1, BRCA2, BRIP1, CDH1, CDKN2A, CHEK2, DICER1, EPCAM, GREM1, HOXB13, KIT, MEN1, MLH1, MSH2, MSH6, MUTYH, NBN, NF1, PALB2, PDGFRA, PMS2, POLD1, POLE, PTEN, RAD50, RAD51C, RAD51D, SDHA, SDHB, SDHC, SDHD, SMAD4, SMARCA4, STK11, TP53, TSC1, TSC2, VHL).  Rosanna Randy syndrome  2018  . Hiatal hernia   . History of kidney stones   . HSV-1 infection   . Kidney stones    rt with lithotripsy    Past Surgical History:  Procedure Laterality Date  . COLPOSCOPY     CIN II  . HYSTEROSCOPY N/A 11/22/2019   Procedure: HYSTEROSCOPY Hysteroscopic IUD removal and reinsertion;  Surgeon: Nunzio Cobbs, MD;  Location: [facility];  Service: Gynecology;  Laterality: N/A;  . INTRAUTERINE DEVICE (IUD) INSERTION N/A 11/22/2019   Procedure: INTRAUTERINE DEVICE (IUD) INSERTION;  Surgeon: Nunzio Cobbs, MD;  Location: [facility];  Service: Gynecology;  Laterality: N/A;  . IUD REMOVAL N/A 11/22/2019   Procedure: INTRAUTERINE DEVICE (IUD) REMOVAL;  Surgeon: Nunzio Cobbs, MD;  Location: [facility];  Service: Gynecology;  Laterality: N/A;  . LAPAROSCOPIC OVARIAN CYSTECTOMY Left 01/24/2014   Procedure: LAPAROSCOPIC OVARIAN LEFT CYSTECTOMY; BIOPSY UTERINE SEROSA;  Surgeon: Jamey Reas de Berton Lan, MD;  Location: [facility];  Service: Gynecology;  Laterality: Left;  . LYSIS OF ADHESION N/A 01/24/2014   Procedure: LYSIS OF ADHESION, fulgeration  of endometriosis ;  Surgeon: Jamey Reas de Berton Lan, MD;  Location: [facility];  Service: Gynecology;  Laterality: N/A;  . SHOULDER ARTHROSCOPY WITH ROTATOR CUFF REPAIR AND OPEN BICEPS TENODESIS Right   . TONSILLECTOMY      Current Outpatient Medications  Medication Sig Dispense Refill  . escitalopram (LEXAPRO) 20 MG tablet Take 1 tablet (20 mg total) by mouth daily. (Patient taking differently: Take 20 mg by mouth at bedtime. ) 90 tablet 3  . esomeprazole (NEXIUM) 40 MG capsule Take 1 capsule by mouth daily.  1  . levonorgestrel (MIRENA, 52 MG,) 20 MCG/24HR IUD 1 Intra Uterine Device (1 each total) by Intrauterine route once for 1 dose. To be inserted by physician during surgery. 1 each 0   No current facility-administered medications for this  visit.     ALLERGIES: Patient has no known allergies.  Family History  Problem Relation Age of Onset  . Ovarian cancer Maternal Grandmother 14  . Colon cancer Maternal Grandmother 55       deceased 93  . Heart disease Paternal Grandfather   . Lung cancer Father 48       smoker; currently 59  . Ovarian cancer Maternal Aunt        Dx 23s; deceased 50s    Social History   Socioeconomic History  . Marital status: Married    Spouse name: Not on file  . Number of children: 0  . Years of education: Not on file  . Highest education level: Not on file  Occupational History  . Occupation: Tour manager  Tobacco Use  . Smoking status: Never Smoker  . Smokeless tobacco: Never Used  Substance and Sexual Activity  . Alcohol use: Yes    Alcohol/week: 2.0 standard drinks    Types: 2 Standard drinks or equivalent per week    Comment: 1-2 drinks a week (alcohol, wine or beer)  . Drug use: No  . Sexual activity: Yes    Partners: Male    Birth control/protection: I.U.D.    Comment: Mirena inserted 08-14-14  Other Topics Concern  . Not on file  Social History Narrative  . Not on file   Social Determinants of Health   Financial Resource Strain:   . Difficulty of Paying Living Expenses: Not on file  Food Insecurity:   . Worried About Charity fundraiser in the Last Year: Not on file  . Ran Out of Food in the Last Year: Not on file  Transportation Needs:   . Lack of Transportation (Medical): Not on file  . Lack of Transportation (Non-Medical): Not on file  Physical Activity:   . Days of Exercise per Week: Not on file  . Minutes of Exercise per Session: Not on file  Stress:   . Feeling of Stress : Not on file  Social Connections:   . Frequency of Communication with Friends and Family: Not on file  . Frequency of Social Gatherings with Friends and Family: Not on file  . Attends Religious Services: Not on file  . Active Member of Clubs or Organizations: Not on file  .  Attends Archivist Meetings: Not on file  . Marital Status: Not on file  Intimate Partner Violence:   . Fear of Current or Ex-Partner: Not on file  . Emotionally Abused: Not on file  . Physically Abused: Not on file  . Sexually Abused: Not on file    Review of Systems  All other systems reviewed and  are negative.   PHYSICAL EXAMINATION:    BP 110/70   Pulse 70   Temp (!) 97.3 F (36.3 C) (Temporal)   Ht 5' 5" (1.651 m)   Wt 147 lb (66.7 kg)   BMI 24.46 kg/m     General appearance: alert, cooperative and appears stated age   Pelvic: External genitalia:  no lesions              Urethra:  normal appearing urethra with no masses, tenderness or lesions              Bartholins and Skenes: normal                 Vagina: normal appearing vagina with normal color and discharge, no lesions              Cervix: no lesions.  IUD strings noted.                Bimanual Exam:  Uterus:  normal size, contour, position, consistency, mobility, non-tender              Adnexa: no mass, fullness, tenderness        Chaperone was present for exam.  ASSESSMENT  IUD check up.  Doing well.  Vulvar lesion.  Resolved.  HSV I?  Bartholin's gland cyst?  PLAN  We discussed her surgical findings of a normal uterus.  We reviewed oral HSV I and antiviral treatment for prevention of outbreaks.  I educated that she HSV I can also go to the vulvar area.  She currently declines Valtrex.  Fu for annual exam and prn.   __15____ minutes face to face time of which over 50% was spent in counseling.

## 2020-01-03 ENCOUNTER — Ambulatory Visit: Payer: 59 | Admitting: Obstetrics and Gynecology

## 2020-06-12 ENCOUNTER — Ambulatory Visit: Payer: 59 | Admitting: Obstetrics and Gynecology

## 2020-06-21 ENCOUNTER — Other Ambulatory Visit: Payer: Self-pay | Admitting: Obstetrics and Gynecology

## 2020-06-21 NOTE — Telephone Encounter (Signed)
Medication refill request: Lexapro Last AEX:  06/02/19 Dr. Quincy Simmonds Next AEX: 10/03/20 Last MMG (if hormonal medication request): n/a Refill authorized: Today, please advise

## 2020-10-02 NOTE — Progress Notes (Signed)
34 y.o. G28P0000 Married Caucasian female here for annual exam.    Patient complaining of slight cramping in LLQ and in upper thigh x 3-4 months. The pain is random. The cramping can hit hard and it can last 5 minutes.  Can spot at the same time.   Taking Lexapro 20 mg daily.  Working well.  Uses Xanax for flying only.   Received her Covid vaccine.  Received flu vaccine.   PCP: [facility] in [place]    No LMP recorded. (Menstrual status: IUD).     Period Cycle (Days):  (no real cycle with Mirena IUD)     Sexually active: Yes.    The current method of family planning is IUD--Mirena 11-22-19.    Exercising: Yes.    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 MMG:  n/a Colonoscopy: 2015 normal--done for abdominal pain BMD:   n/a  Result  n/a TDaP:  2016 Gardasil:   Yes, completed HIV:Neg in the past Hep C:Neg in the past Screening Labs:  Today.    reports that she has never smoked. She has never used smokeless tobacco. She reports current alcohol use of about 2.0 standard drinks of alcohol per week. She reports that she does not use drugs.  Past Medical History:  Diagnosis Date  . 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 Anxiety   . Colonic dysmotility    small bowel  . Elevated bilirubin 12/25/2016  . Family history of colon cancer   . Family history of ovarian cancer   . Genetic testing 01/01/2017   Result: No pathogenic mutations detected  Genes Tested: 43 genes on [employer]'s Common Cancers panel (APC, ATM, AXIN2, BARD1, BMPR1A, BRCA1, BRCA2, BRIP1, CDH1, CDKN2A, CHEK2, DICER1, EPCAM, GREM1, HOXB13, KIT, MEN1, MLH1, MSH2, MSH6, MUTYH, NBN, NF1, PALB2, PDGFRA, PMS2, POLD1, POLE, PTEN, RAD50, RAD51C, RAD51D, SDHA, SDHB, SDHC, SDHD, SMAD4,  SMARCA4, STK11, TP53, TSC1, TSC2, VHL).  Rosanna Randy syndrome 2018  . Hiatal hernia   . History of kidney stones   . HSV-1 infection   . Kidney stones    rt with lithotripsy    Past Surgical History:  Procedure Laterality Date  . COLPOSCOPY     CIN II  . HYSTEROSCOPY N/A 11/22/2019   Procedure: HYSTEROSCOPY Hysteroscopic IUD removal and reinsertion;  Surgeon: Nunzio Cobbs, MD;  Location: [facility];  Service: Gynecology;  Laterality: N/A;  . INTRAUTERINE DEVICE (IUD) INSERTION N/A 11/22/2019   Procedure: INTRAUTERINE DEVICE (IUD) INSERTION;  Surgeon: Nunzio Cobbs, MD;  Location: [facility];  Service: Gynecology;  Laterality: N/A;  . IUD REMOVAL N/A 11/22/2019   Procedure: INTRAUTERINE DEVICE (IUD) REMOVAL;  Surgeon: Nunzio Cobbs, MD;  Location: [facility];  Service: Gynecology;  Laterality: N/A;  . LAPAROSCOPIC OVARIAN CYSTECTOMY Left 01/24/2014   Procedure: LAPAROSCOPIC OVARIAN LEFT CYSTECTOMY; BIOPSY UTERINE SEROSA;  Surgeon: Jamey Reas de Berton Lan, MD;  Location: [facility];  Service: Gynecology;  Laterality: Left;  . LYSIS OF ADHESION N/A 01/24/2014   Procedure: LYSIS OF ADHESION, fulgeration of endometriosis ;  Surgeon: Jamey Reas de Berton Lan, MD;  Location: [facility];  Service: Gynecology;  Laterality:  N/A;  . SHOULDER ARTHROSCOPY WITH ROTATOR CUFF REPAIR AND OPEN BICEPS TENODESIS Right   . TONSILLECTOMY      Current Outpatient Medications  Medication Sig Dispense Refill  . ALPRAZolam (XANAX) 0.5 MG tablet Take by mouth. Takes when flying    . escitalopram (LEXAPRO) 20 MG tablet Take 1 tablet by mouth once daily 90 tablet 1  . esomeprazole (NEXIUM) 40 MG capsule Take 1 capsule by mouth daily.  1  . levonorgestrel (MIRENA, 52 MG,) 20 MCG/24HR IUD 1 Intra Uterine Device (1 each total) by Intrauterine route once for 1 dose. To be inserted by physician during surgery. 1 each 0    No current facility-administered medications for this visit.    Family History  Problem Relation Age of Onset  . Ovarian cancer Maternal Grandmother 75  . Colon cancer Maternal Grandmother 37       deceased 61  . Heart disease Paternal Grandfather   . Lung cancer Father 45       smoker; currently 14  . Ovarian cancer Maternal Aunt        Dx 50s; deceased 84s    Review of Systems  Genitourinary: Positive for pelvic pain (LLQ pain).  All other systems reviewed and are negative.   Exam:   BP 100/68   Pulse (!) 58   Ht 5' 5"  (1.651 m)   Wt 145 lb (65.8 kg)   SpO2 100%   BMI 24.13 kg/m     General appearance: alert, cooperative and appears stated age Head: normocephalic, without obvious abnormality, atraumatic Neck: no adenopathy, supple, symmetrical, trachea midline and thyroid normal to inspection and palpation Lungs: clear to auscultation bilaterally Breasts: normal appearance, no masses or tenderness, No nipple retraction or dimpling, No nipple discharge or bleeding, No axillary adenopathy Heart: regular rate and rhythm Abdomen: soft, non-tender; no masses, no organomegaly Extremities: extremities normal, atraumatic, no cyanosis or edema Skin: skin color, texture, turgor normal. No rashes or lesions Lymph nodes: cervical, supraclavicular, and axillary nodes normal. Neurologic: grossly normal  Pelvic: External genitalia:  no lesions              No abnormal inguinal nodes palpated.              Urethra:  normal appearing urethra with no masses, tenderness or lesions              Bartholins and Skenes: normal                 Vagina: normal appearing vagina with normal color and discharge, no lesions              Cervix: no lesions.  IUD strings noted.              Pap taken: No. Bimanual Exam:  Uterus:  normal size, contour, position, consistency, mobility, non-tender              Adnexa: no mass, fullness, tenderness              Rectal exam: Yes.  .  Confirms.               Anus:  normal sphincter tone, no lesions  Chaperone was present for exam.  Assessment:   Well woman visit with normal exam. Hx CIN II.  Mirena IUD.  Hx endometriosis. LLQ pain.  FH colon, ovarian, and lung cancer.  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 Anxiety.   Plan: Mammogram screening discussed. Self breast awareness reviewed. Pap and  HR HPV 2023. Guidelines for Calcium, Vitamin D, regular exercise program including cardiovascular and weight bearing exercise. Routine labs.  Patient would like to do observational management of her LLQ pain.  She will call if it worsens. Follow up annually and prn.

## 2020-10-03 ENCOUNTER — Other Ambulatory Visit: Payer: Self-pay

## 2020-10-03 ENCOUNTER — Encounter: Payer: Self-pay | Admitting: Obstetrics and Gynecology

## 2020-10-03 ENCOUNTER — Ambulatory Visit: Payer: 59 | Admitting: Obstetrics and Gynecology

## 2020-10-03 VITALS — BP 100/68 | HR 58 | Ht 65.0 in | Wt 145.0 lb

## 2020-10-03 DIAGNOSIS — Z01419 Encounter for gynecological examination (general) (routine) without abnormal findings: Secondary | ICD-10-CM | POA: Diagnosis not present

## 2020-10-03 MED ORDER — ESCITALOPRAM OXALATE 20 MG PO TABS: 20.0000 mg | ORAL_TABLET | Freq: Every day | ORAL | 3 refills | Status: AC

## 2020-10-03 NOTE — Patient Instructions (Signed)

## 2020-10-04 LAB — CBC
Hematocrit: 38.2 % (ref 34.0–46.6)
Hemoglobin: 12.6 g/dL (ref 11.1–15.9)
MCH: 31.7 pg (ref 26.6–33.0)
MCHC: 33 g/dL (ref 31.5–35.7)
MCV: 96 fL (ref 79–97)
Platelets: 238 10*3/uL (ref 150–450)
RBC: 3.98 x10E6/uL (ref 3.77–5.28)
RDW: 11.3 % — ABNORMAL LOW (ref 11.7–15.4)
WBC: 6.3 10*3/uL (ref 3.4–10.8)

## 2020-10-04 LAB — LIPID PANEL
Chol/HDL Ratio: 2.3 ratio (ref 0.0–4.4)
Cholesterol, Total: 151 mg/dL (ref 100–199)
HDL: 65 mg/dL (ref >39)
LDL Chol Calc (NIH): 74 mg/dL (ref 0–99)
Triglycerides: 55 mg/dL (ref 0–149)
VLDL Cholesterol Cal: 12 mg/dL (ref 5–40)

## 2020-10-04 LAB — COMPREHENSIVE METABOLIC PANEL
ALT: 13 IU/L (ref 0–32)
AST: 15 IU/L (ref 0–40)
Albumin/Globulin Ratio: 1.9 (ref 1.2–2.2)
Albumin: 4.6 g/dL (ref 3.8–4.8)
Alkaline Phosphatase: 51 IU/L (ref 44–121)
BUN/Creatinine Ratio: 20 (ref 9–23)
BUN: 14 mg/dL (ref 6–20)
Bilirubin Total: 1.3 mg/dL — ABNORMAL HIGH (ref 0.0–1.2)
CO2: 25 mmol/L (ref 20–29)
Calcium: 9.3 mg/dL (ref 8.7–10.2)
Chloride: 101 mmol/L (ref 96–106)
Creatinine, Ser: 0.71 mg/dL (ref 0.57–1.00)
GFR calc Af Amer: 127 mL/min/{1.73_m2} (ref >59)
GFR calc non Af Amer: 110 mL/min/{1.73_m2} (ref >59)
Globulin, Total: 2.4 g/dL (ref 1.5–4.5)
Glucose: 73 mg/dL (ref 65–99)
Potassium: 4.3 mmol/L (ref 3.5–5.2)
Sodium: 139 mmol/L (ref 134–144)
Total Protein: 7 g/dL (ref 6.0–8.5)

## 2020-10-04 LAB — VITAMIN D 25 HYDROXY (VIT D DEFICIENCY, FRACTURES): Vit D, 25-Hydroxy: 32.5 ng/mL (ref 30.0–100.0)

## 2021-08-06 ENCOUNTER — Telehealth: Payer: Self-pay | Admitting: *Deleted

## 2021-08-06 DIAGNOSIS — N939 Abnormal uterine and vaginal bleeding, unspecified: Secondary | ICD-10-CM

## 2021-08-06 NOTE — Telephone Encounter (Signed)
Patient called has annual exam scheduled on 10/07/21 asked if ultrasound can be scheduled same day? Patient reports this has been done in past. States she has been having issues with cycle changes, random spotting off/on. She lives in [place] and would like to make only 1 trip here if possible. Please advise

## 2021-08-07 NOTE — Telephone Encounter (Signed)
Patient informed, order placed at [facility], number given to patient to call and schedule.

## 2021-08-07 NOTE — Telephone Encounter (Signed)
[facility] for pelvic US on 10/07/21 prior to her appointment at [facility] if possible.  This would need to be a call report to office upon finishing study.  (We do not have ultrasound in office that day.)  Diagnosis is abnormal uterine bleeding.

## 2021-08-08 NOTE — Telephone Encounter (Signed)
Ultrasound on 10/07/21 @ 9:45am

## 2021-10-03 NOTE — Progress Notes (Signed)
35 y.o. G36P0000 Married Caucasian female here for annual exam.    Patient is having pelvic pain again and feels it is related to her endometriosis. Having pain during intercourse.  Increase in bleeding over the last 2 years, and it is random.  Having phantom cramps down her legs, with no relationship to bleeding.   She was seen in the ER in [place] due to abdominal pain and nausea and low grade fever.  Negative Covid test. Normal CT scan.  She was told may have a virus.  This happened again about a month later.   Has reflux and will do endoscopy later this year.   Normal pelvic US today at [place] [facility].   Declines future childbearing but does not want hysterectomy at this time.   Has decreased libido.  Denies hx HTN, migraine with aura, personal or family hx of thromboembolic event.  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 Used NuvaRing in the past.   PCP:  Elvina Mattes, MD  No LMP recorded. (Menstrual status: IUD).     Period Cycle (Days):  (Mirena IUD--cycles not regular)     Sexually active: Yes.    The current method of family planning is IUD--Mirena 11/22/19.    Exercising: Yes.     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 MMG:  n/a Colonoscopy:  2015 normal--done for abdominal pain BMD:   n/a  Result  n/a TDaP: 2016 Gardasil:   yes HIV: Neg in the past Hep C: Neg in the past Screening Labs:  PCP.  Received flu and Covid booster.   reports that she has never smoked. She has never used smokeless tobacco. She reports that she does not currently use alcohol. She reports that she does not use drugs.  Past Medical History:  Diagnosis Date   Abnormal Pap smear 10/2010   colpo/CIN II   Anxiety    Colonic dysmotility    small bowel   Elevated bilirubin 12/25/2016   Family  history of colon cancer    Family history of ovarian cancer    Genetic testing 01/01/2017   Result: No pathogenic mutations detected  Genes Tested: 43 genes on [employer]'s Common Cancers panel (APC, ATM, AXIN2, BARD1, BMPR1A, BRCA1, BRCA2, BRIP1, CDH1, CDKN2A, CHEK2, DICER1, EPCAM, GREM1, HOXB13, KIT, MEN1, MLH1, MSH2, MSH6, MUTYH, NBN, NF1, PALB2, PDGFRA, PMS2, POLD1, POLE, PTEN, RAD50, RAD51C, RAD51D, SDHA, SDHB, SDHC, SDHD, SMAD4, SMARCA4, STK11, TP53, TSC1, TSC2, VHL).   Beth Huerta 2018   Hiatal hernia    History of kidney stones    HSV-1 infection    Kidney stones    rt with lithotripsy    Past Surgical History:  Procedure Laterality Date   COLPOSCOPY     CIN II   HYSTEROSCOPY N/A 11/22/2019   Procedure: HYSTEROSCOPY Hysteroscopic IUD removal and reinsertion;  Surgeon: Nunzio Cobbs, MD;  Location[facility];  Service: Gynecology;  Laterality: N/A;   INTRAUTERINE DEVICE (IUD) INSERTION N/A 11/22/2019   Procedure: INTRAUTERINE DEVICE (IUD) INSERTION;  Surgeon: Nunzio Cobbs, MD;  Location: [facility];  Service: Gynecology;  Laterality: N/A;   IUD REMOVAL N/A 11/22/2019   Procedure: INTRAUTERINE DEVICE (IUD) REMOVAL;  Surgeon: Nunzio Cobbs, MD;  Location: [facility];  Service: Gynecology;  Laterality: N/A;   LAPAROSCOPIC OVARIAN CYSTECTOMY Left 01/24/2014   Procedure: LAPAROSCOPIC OVARIAN LEFT CYSTECTOMY; BIOPSY UTERINE SEROSA;  Surgeon: Jamey Reas de Berton Lan, MD;  Location: [facility];  Service: Gynecology;  Laterality: Left;   LYSIS OF ADHESION N/A 01/24/2014   Procedure: LYSIS OF ADHESION, fulgeration of endometriosis ;  Surgeon: Jamey Reas de Berton Lan, MD;  Location: [facility];  Service: Gynecology;  Laterality: N/A;   SHOULDER ARTHROSCOPY WITH ROTATOR CUFF REPAIR AND OPEN BICEPS TENODESIS Right    TONSILLECTOMY      Current Outpatient Medications  Medication Sig Dispense  Refill   ALPRAZolam (XANAX) 0.5 MG tablet Take by mouth. Takes when flying     escitalopram (LEXAPRO) 20 MG tablet Take 1 tablet (20 mg total) by mouth daily. 90 tablet 3   esomeprazole (NEXIUM) 20 MG capsule Take by mouth.     levonorgestrel (MIRENA, 52 MG,) 20 MCG/24HR IUD 1 Intra Uterine Device (1 each total) by Intrauterine route once for 1 dose. To be inserted by physician during surgery. 1 each 0   No current facility-administered medications for this visit.    Family History  Problem Relation Age of Onset   Ovarian cancer Maternal Grandmother 77   Colon cancer Maternal Grandmother 52       deceased 51   Heart disease Paternal Grandfather    Lung cancer Father 37       smoker; currently 58   Ovarian cancer Maternal Aunt        Dx 22s; deceased 5s    Review of Systems  Genitourinary:  Positive for pelvic pain.  All other systems reviewed and are negative.  Exam:   BP 100/60   Pulse 60   Resp 16   Ht 5' 5"  (1.651 m)   Wt 140 lb (63.5 kg)   BMI 23.30 kg/m     General appearance: alert, cooperative and appears stated age Head: normocephalic, without obvious abnormality, atraumatic Neck: no adenopathy, supple, symmetrical, trachea midline and thyroid normal to inspection and palpation Lungs: clear to auscultation bilaterally Breasts: normal appearance, no masses or tenderness, No nipple retraction or dimpling, No nipple discharge or bleeding, No axillary adenopathy Heart: regular rate and rhythm Abdomen: soft, non-tender; no masses, no organomegaly Extremities: extremities normal, atraumatic, no cyanosis or edema Skin: skin color, texture, turgor normal. No rashes or lesions Lymph nodes: cervical, supraclavicular, and axillary nodes normal. Neurologic: grossly normal  Pelvic: External genitalia:  no lesions              No abnormal inguinal nodes palpated.              Urethra:  normal appearing urethra with no masses, tenderness or lesions              Bartholins  and Skenes: normal                 Vagina: normal appearing vagina with normal color and discharge, no lesions              Cervix: no lesions.  IUD strings noted.               Pap taken: no Bimanual Exam:  Uterus:  normal size, contour, position, consistency, mobility, non-tender              Adnexa: no mass, fullness, tenderness    Chaperone was present for exam:  Estill Bamberg, CMA  Assessment:  Well woman visit with gynecologic exam. Mirena IUD.  Hx endometriosis. Pelvic pain. Normal pelvic US today. Hx CIN II.  No LEEP. FH colon, ovarian, and lung cancer.  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 Anxiety.   Plan: Mammogram screening discussed. Self breast awareness reviewed. Pap and HR HPV as above. Guidelines for Calcium, Vitamin D, regular exercise program including cardiovascular and weight bearing exercise. Follow up annually and prn.   Pelvic ultrasound images and report reviewed today in addition to her annual exam.  We also discussed [place], laparoscopic evaluation and treatment of endometriosis, and adding combined contraception for treatment of pelvic pain and endometriosis.  She will continue with Mirena and start Loestrin 24.  Disp:  3 packs, RF:  3.  I discussed risk of stroke, DVT, PE, and MI along with warning signs.  20 additional min  total time was spent for this patient encounter, including preparation, face-to-face counseling with the patient, coordination of care, and documentation of the encounter.  After visit summary provided.

## 2021-10-07 ENCOUNTER — Ambulatory Visit (INDEPENDENT_AMBULATORY_CARE_PROVIDER_SITE_OTHER): Payer: 59 | Admitting: Obstetrics and Gynecology

## 2021-10-07 ENCOUNTER — Ambulatory Visit
Admission: RE | Admit: 2021-10-07 | Discharge: 2021-10-07 | Disposition: A | Discharge status: Home / Self Care | Payer: 59 | Source: Ambulatory Visit | Attending: Obstetrics and Gynecology | Admitting: Obstetrics and Gynecology

## 2021-10-07 ENCOUNTER — Encounter: Payer: Self-pay | Admitting: Obstetrics and Gynecology

## 2021-10-07 ENCOUNTER — Telehealth: Payer: Self-pay

## 2021-10-07 ENCOUNTER — Other Ambulatory Visit: Payer: Self-pay

## 2021-10-07 VITALS — BP 100/60 | HR 60 | Resp 16 | Ht 65.0 in | Wt 140.0 lb

## 2021-10-07 DIAGNOSIS — Z01419 Encounter for gynecological examination (general) (routine) without abnormal findings: Secondary | ICD-10-CM | POA: Diagnosis not present

## 2021-10-07 DIAGNOSIS — N939 Abnormal uterine and vaginal bleeding, unspecified: Secondary | ICD-10-CM

## 2021-10-07 MED ORDER — NORETHIN ACE-ETH ESTRAD-FE 1-20 MG-MCG(24) PO TABS: 1.0000 | ORAL_TABLET | Freq: Every day | ORAL | 3 refills | Status: AC

## 2021-10-07 NOTE — Patient Instructions (Signed)

## 2021-10-07 NOTE — Telephone Encounter (Signed)
Results discussed with patient at office visit today.  Encounter closed.

## 2021-10-07 NOTE — Telephone Encounter (Signed)
Levada Dy from [facility] called in regards to patietn. She wanted to note what the patietn had done.  "Pelvic ultrasound, IUD satisfactory position, and unremarkable exam of pelvis".   Routing to Triage and Dr. Quincy Simmonds.

## 2021-12-05 IMAGING — US US PELVIS COMPLETE WITH TRANSVAGINAL
1 series · 14 of 25 positions shown · non-contrast
Comparison: None

CLINICAL DATA: Abnormal uterine bleeding pelvic sonogram dated
November 14, 2019



[Series 1: us pelvis complete with transvaginal · 0.20mm/px · 14 of 53 slices shown]
[im 1/53]
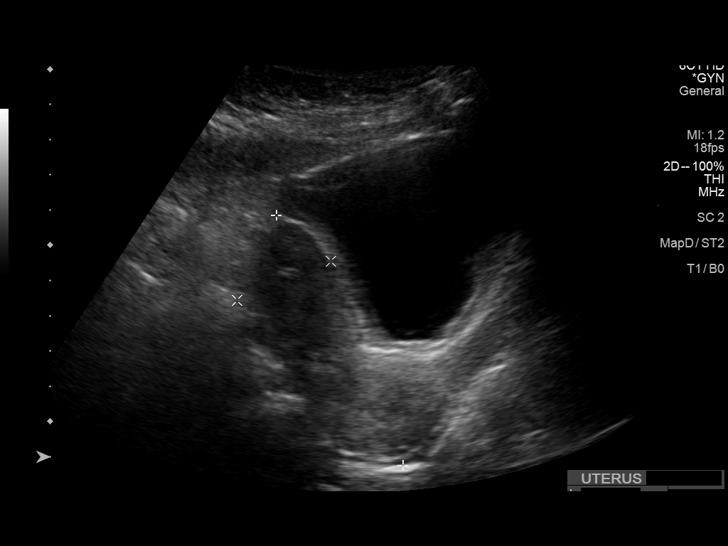
[im 5/53]
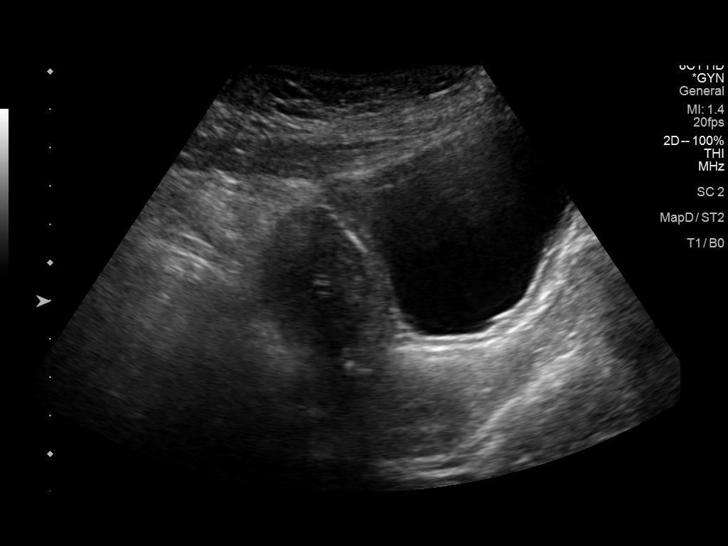
[im 9/53]
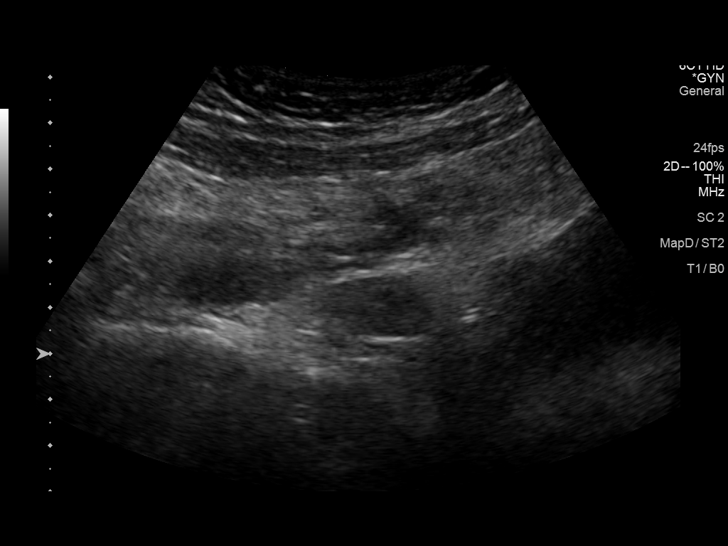
[im 14/53]
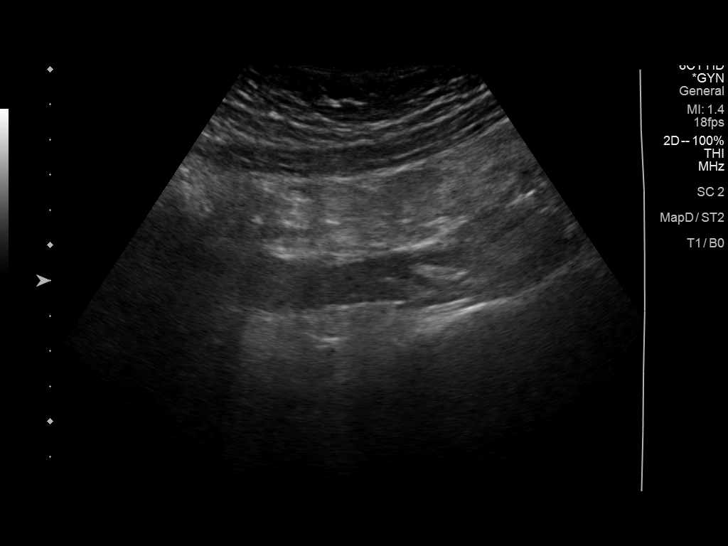
[im 18/53]
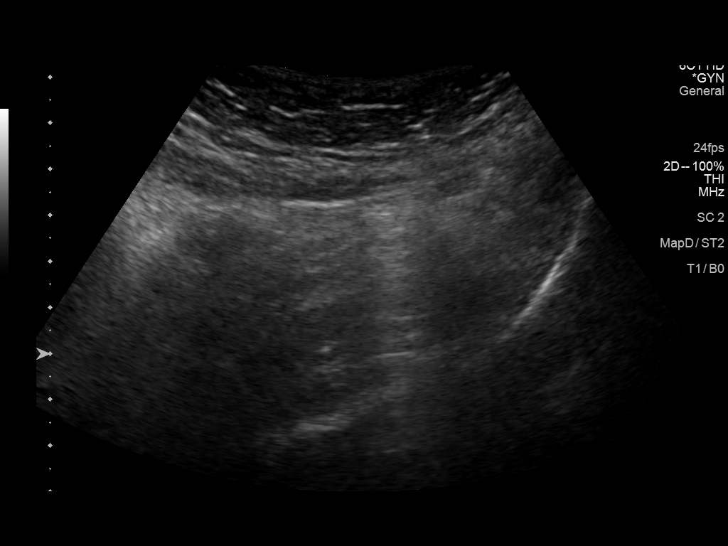
[im 20/53]
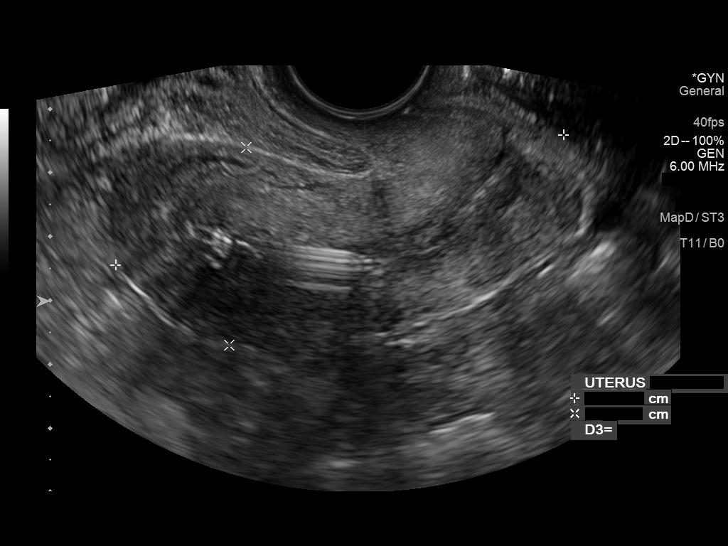
[im 24/53]
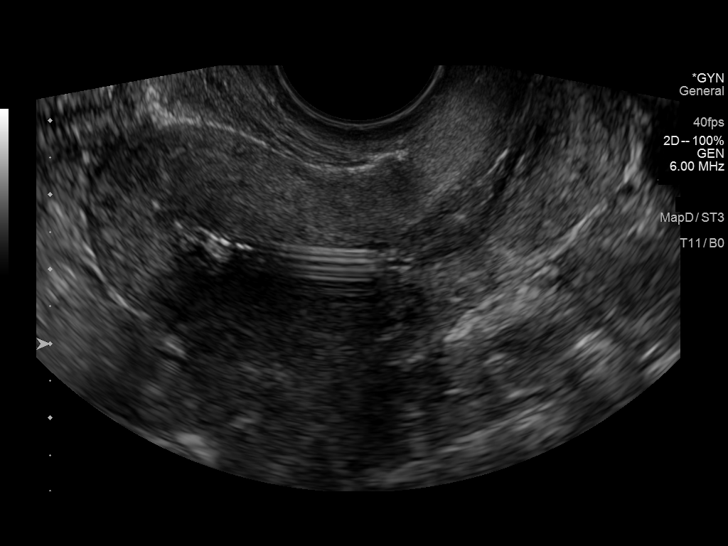
[im 29/53]
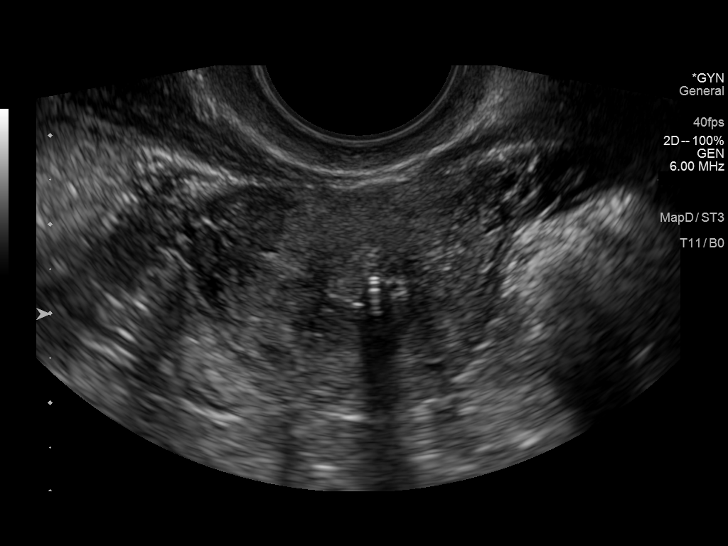
[im 33/53]
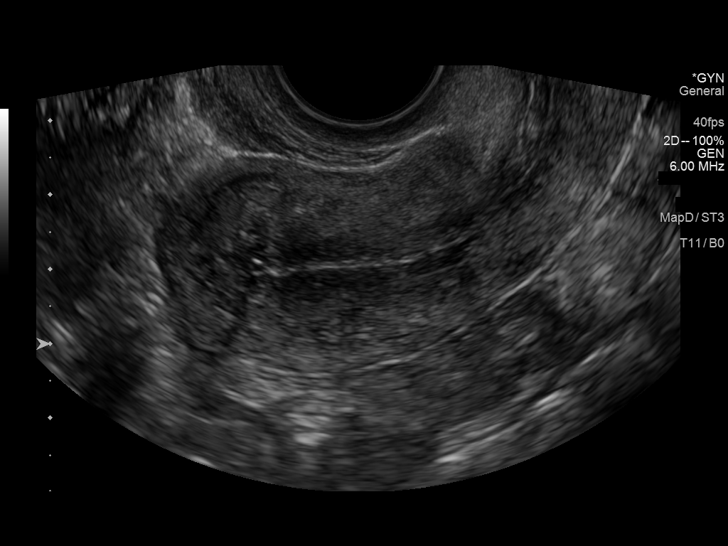
[im 35/53]
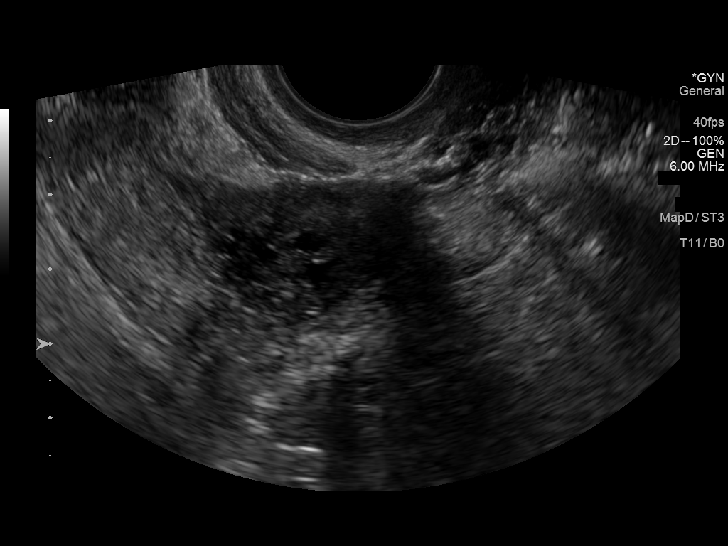
[im 40/53]
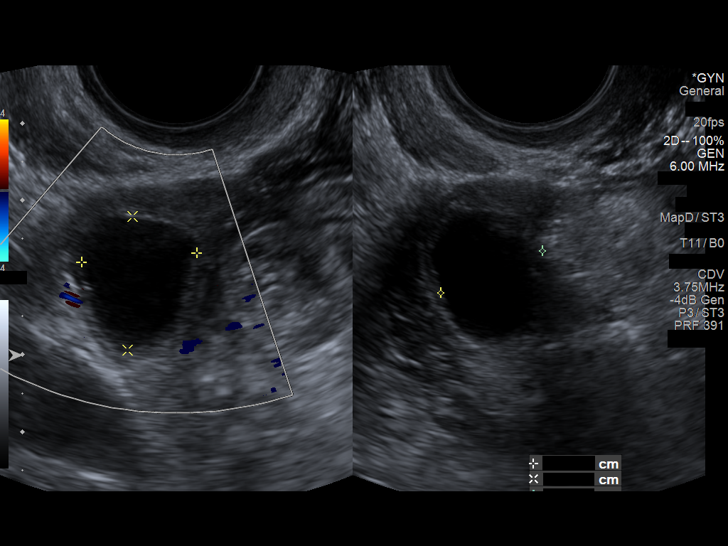
[im 44/53]
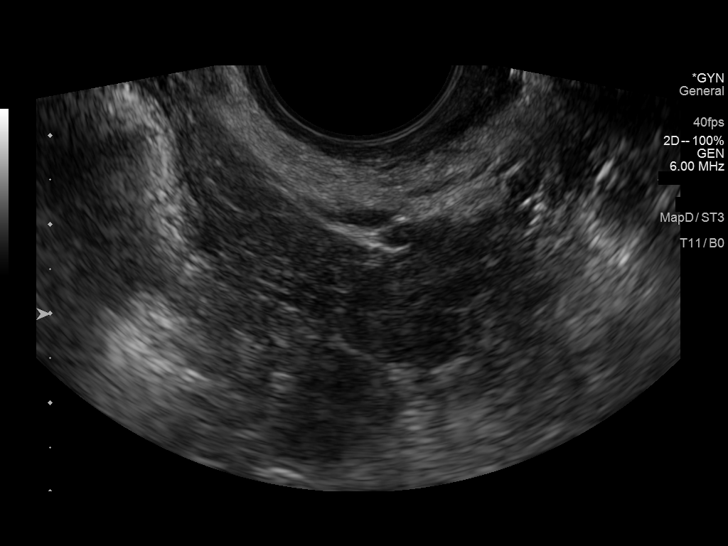
[im 48/53]
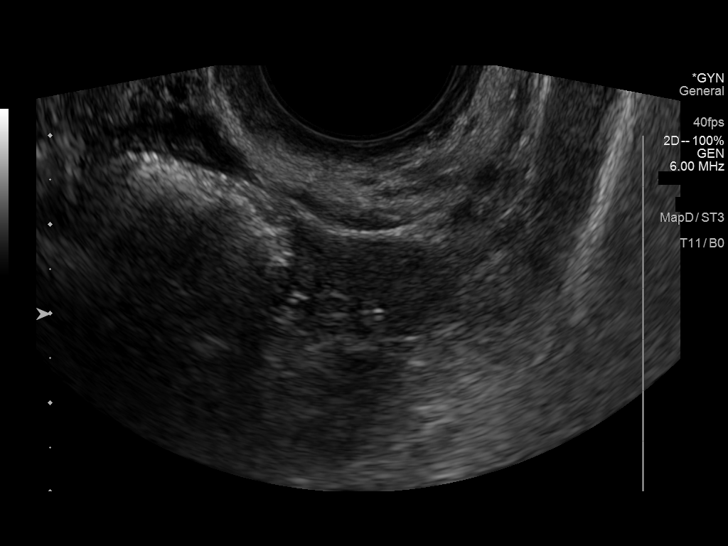
[im 53/53]
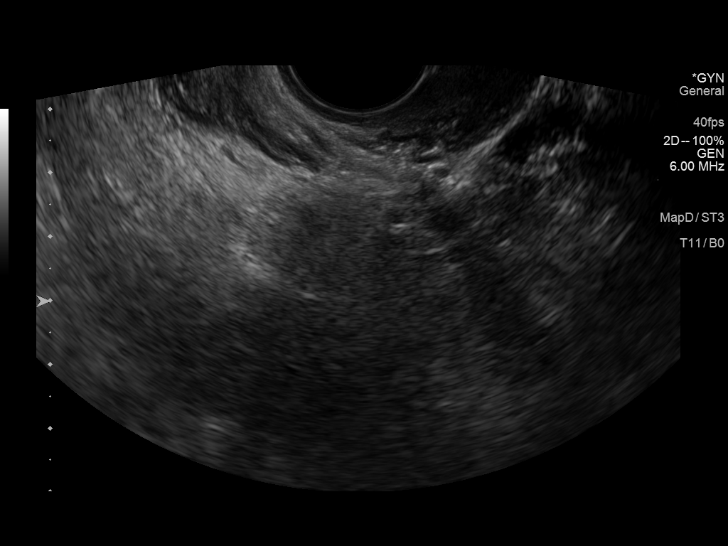

[14 of 25 positions shown; findings below may reference images not displayed]

FINDINGS: Uterus

Measurements: 8.0 x 2.0 x 3.9 = volume: 46 mL. No fibroids or other
mass visualized.

Endometrium

Thickness: 1.4. No focal abnormality visualized. Intrauterine
contraceptive device in satisfactory position.

Right ovary

Measurements: 2.7 x 2.4 x 1.7 = volume: 6.0 mL. Normal appearance/no
adnexal mass. Dominant follicle is noted.

Left ovary

Measurements: 1.3 x 1.6 x 1.7 = volume: 1.8 mL. Normal appearance/no
adnexal mass.

Other findings

No abnormal free fluid.
IMPRESSION: 1.  Intrauterine contraceptive device in satisfactory position.

2.  Unremarkable examination of the pelvis.

## 2022-01-31 ENCOUNTER — Encounter: Payer: Self-pay | Admitting: Obstetrics and Gynecology
